# Patient Record
Sex: Female | Born: 1970 | Race: Black or African American | Hispanic: No | Marital: Married | Smoking: Former smoker
Health system: Southern US, Community
[De-identification: ages and names within clinical notes are randomized; demographics above are authoritative.]

## PROBLEM LIST (undated history)

## (undated) ENCOUNTER — Emergency Department (HOSPITAL_COMMUNITY): Disposition: A | Discharge status: Home / Self Care | Payer: Self-pay

## (undated) DIAGNOSIS — M1990 Unspecified osteoarthritis, unspecified site: Secondary | ICD-10-CM

## (undated) DIAGNOSIS — IMO0002 Reserved for concepts with insufficient information to code with codable children: Secondary | ICD-10-CM

## (undated) DIAGNOSIS — M722 Plantar fascial fibromatosis: Secondary | ICD-10-CM

## (undated) DIAGNOSIS — J40 Bronchitis, not specified as acute or chronic: Secondary | ICD-10-CM

## (undated) DIAGNOSIS — R0602 Shortness of breath: Secondary | ICD-10-CM

## (undated) DIAGNOSIS — R079 Chest pain, unspecified: Secondary | ICD-10-CM

## (undated) DIAGNOSIS — F419 Anxiety disorder, unspecified: Secondary | ICD-10-CM

## (undated) DIAGNOSIS — G5600 Carpal tunnel syndrome, unspecified upper limb: Secondary | ICD-10-CM

## (undated) HISTORY — PX: CHOLECYSTECTOMY: SHX55

---

## 2006-12-27 ENCOUNTER — Emergency Department (HOSPITAL_COMMUNITY): Admission: EM | Admit: 2006-12-27 | Discharge: 2006-12-27 | Payer: Self-pay | Admitting: Emergency Medicine

## 2007-04-12 ENCOUNTER — Emergency Department (HOSPITAL_COMMUNITY): Admission: EM | Admit: 2007-04-12 | Discharge: 2007-04-12 | Payer: Self-pay | Admitting: Emergency Medicine

## 2007-04-27 ENCOUNTER — Emergency Department (HOSPITAL_COMMUNITY): Admission: EM | Admit: 2007-04-27 | Discharge: 2007-04-27 | Payer: Self-pay | Admitting: Emergency Medicine

## 2010-11-13 LAB — URINALYSIS, ROUTINE W REFLEX MICROSCOPIC
Bilirubin Urine: NEGATIVE
Glucose, UA: NEGATIVE
Ketones, ur: NEGATIVE
Specific Gravity, Urine: 1.025
Urobilinogen, UA: 1

## 2010-11-13 LAB — DIFFERENTIAL
Eosinophils Absolute: 0.1
Eosinophils Relative: 1
Lymphocytes Relative: 42
Lymphs Abs: 3.5
Monocytes Absolute: 0.5

## 2010-11-13 LAB — CBC
HCT: 42.3
MCHC: 33.7
MCV: 92.1
Platelets: 225
RBC: 4.59
RDW: 13.7
WBC: 8.4

## 2010-11-13 LAB — URINE CULTURE: Culture: NO GROWTH

## 2010-11-13 LAB — URINE MICROSCOPIC-ADD ON

## 2010-11-13 LAB — I-STAT 8, (EC8 V) (CONVERTED LAB)
Glucose, Bld: 85
HCT: 47 — ABNORMAL HIGH
Operator id: 146091

## 2011-01-08 ENCOUNTER — Emergency Department (HOSPITAL_COMMUNITY): Payer: Self-pay

## 2011-01-08 ENCOUNTER — Emergency Department (HOSPITAL_COMMUNITY)
Admission: EM | Admit: 2011-01-08 | Discharge: 2011-01-08 | Disposition: A | Discharge status: Home / Self Care | Payer: Self-pay | Attending: Emergency Medicine | Admitting: Emergency Medicine

## 2011-01-08 DIAGNOSIS — IMO0002 Reserved for concepts with insufficient information to code with codable children: Secondary | ICD-10-CM | POA: Insufficient documentation

## 2011-01-08 DIAGNOSIS — M1990 Unspecified osteoarthritis, unspecified site: Secondary | ICD-10-CM

## 2011-01-08 DIAGNOSIS — M1710 Unilateral primary osteoarthritis, unspecified knee: Secondary | ICD-10-CM | POA: Insufficient documentation

## 2011-01-08 DIAGNOSIS — M25569 Pain in unspecified knee: Secondary | ICD-10-CM | POA: Insufficient documentation

## 2011-01-08 MED ORDER — IBUPROFEN 800 MG PO TABS: 800.0000 mg | ORAL_TABLET | Freq: Three times a day (TID) | ORAL | Status: AC

## 2011-01-08 MED ORDER — OXYCODONE-ACETAMINOPHEN 5-325 MG PO TABS
1.0000 | ORAL_TABLET | Freq: Once | ORAL | Status: AC
  Administered 2011-01-08: 1 via ORAL
  Filled 2011-01-08: qty 1

## 2011-01-08 MED ORDER — OXYCODONE-ACETAMINOPHEN 5-325 MG PO TABS: 1.0000 | ORAL_TABLET | ORAL | Status: AC | PRN

## 2011-01-08 NOTE — ED Notes (Signed)
Patient presents with right knee pain x 1 week with swelling today.  Patient denies recent injury.

## 2011-02-28 ENCOUNTER — Encounter (HOSPITAL_COMMUNITY): Payer: Self-pay | Admitting: *Deleted

## 2011-02-28 ENCOUNTER — Emergency Department (HOSPITAL_COMMUNITY)
Admission: EM | Admit: 2011-02-28 | Discharge: 2011-02-28 | Disposition: A | Discharge status: Home / Self Care | Payer: Self-pay | Attending: Emergency Medicine | Admitting: Emergency Medicine

## 2011-02-28 DIAGNOSIS — M79609 Pain in unspecified limb: Secondary | ICD-10-CM | POA: Insufficient documentation

## 2011-02-28 DIAGNOSIS — M5412 Radiculopathy, cervical region: Secondary | ICD-10-CM | POA: Insufficient documentation

## 2011-02-28 DIAGNOSIS — G8929 Other chronic pain: Secondary | ICD-10-CM | POA: Insufficient documentation

## 2011-02-28 DIAGNOSIS — M542 Cervicalgia: Secondary | ICD-10-CM | POA: Insufficient documentation

## 2011-02-28 DIAGNOSIS — M545 Low back pain, unspecified: Secondary | ICD-10-CM | POA: Insufficient documentation

## 2011-02-28 DIAGNOSIS — R29898 Other symptoms and signs involving the musculoskeletal system: Secondary | ICD-10-CM | POA: Insufficient documentation

## 2011-02-28 DIAGNOSIS — R209 Unspecified disturbances of skin sensation: Secondary | ICD-10-CM | POA: Insufficient documentation

## 2011-02-28 MED ORDER — PREDNISONE 20 MG PO TABS: ORAL_TABLET | ORAL | Status: AC

## 2011-02-28 MED ORDER — OXYCODONE-ACETAMINOPHEN 5-325 MG PO TABS: 2.0000 | ORAL_TABLET | ORAL | Status: AC | PRN

## 2011-02-28 NOTE — ED Provider Notes (Signed)
History     CSN: [id number]  Arrival date & time 02/28/11  1425   First MD Initiated Contact with Patient 02/28/11 1635      Chief Complaint  Patient presents with  . Neck Pain    (Consider location/radiation/quality/duration/timing/severity/associated sxs/prior treatment) HPI This 41 year old female has a few weeks neck pain radiating down her right arm her right hand mostly to the thumb and index finger with associated numbness to that distribution of the right arm and hand as well with some slight weakness with right hand grip and biceps strength as well. This pain is constant and moderately severe worse with movement of the right arm is nonexertional. She has no chest pain cough or shortness of breath. She has no trauma. She is chronic low back pain which is stable and unchanged. She has no pain no weakness or numbness to her left arm or legs. She has no change in bowel or bladder control. She has not had any prior treatment or evaluation for this problem. She has not had cervical radiculopathy symptoms in the past. History reviewed. No pertinent past medical history.  Past Surgical History  Procedure Date  . Cholecystectomy     History reviewed. No pertinent family history.  History  Substance Use Topics  . Smoking status: Current Everyday Smoker -- 0.5 packs/day  . Smokeless tobacco: Not on file  . Alcohol Use: Yes    OB History    Grav Para Term Preterm Abortions TAB SAB Ect Mult Living                  Review of Systems  Constitutional: Negative for fever.       10 Systems reviewed and are negative for acute change except as noted in the HPI.  HENT: Positive for neck pain. Negative for congestion.   Eyes: Negative for discharge and redness.  Respiratory: Negative for cough and shortness of breath.   Cardiovascular: Negative for chest pain.  Gastrointestinal: Negative for vomiting and abdominal pain.  Musculoskeletal: Negative for back pain.  Skin: Negative for  rash.  Neurological: Positive for weakness and numbness. Negative for syncope and headaches.  Psychiatric/Behavioral:       No behavior change.    Allergies  Review of patient's allergies indicates no known allergies.  Home Medications   Current Outpatient Rx  Name Route Sig Dispense Refill  . OXYCODONE-ACETAMINOPHEN 5-325 MG PO TABS Oral Take 2 tablets by mouth every 4 (four) hours as needed for pain. 30 tablet 0  . PREDNISONE 20 MG PO TABS  3 tabs po day one, then 2 po daily x 4 days 11 tablet 0    BP 122/74  Pulse 58  Temp(Src) 98.7 F (37.1 C) (Oral)  Resp 20  SpO2 99%  LMP 02/26/2011  Physical Exam  Nursing note and vitals reviewed. Constitutional:       Awake, alert, nontoxic appearance.  HENT:  Head: Atraumatic.  Eyes: Right eye exhibits no discharge. Left eye exhibits no discharge.  Neck: Neck supple.       Cervical spine and per cervical regions are tender bilaterally and in the midline worse in the right paracervical region without swelling or color change to the skin.  Cardiovascular: Normal rate and regular rhythm.   No murmur heard. Pulmonary/Chest: Effort normal and breath sounds normal. No respiratory distress. She has no wheezes. She has no rales. She exhibits no tenderness.  Abdominal: Soft. There is no tenderness. There is no rebound.  Musculoskeletal: She  exhibits no tenderness.       Baseline ROM, left arm and legs are nontender. Right arm has tenderness to the trapezius right paracervical region of the right shoulder joint itself is nontender with passive range of motion. The right arm itself is nontender and she has capillary refill less than 2 seconds in all digits to the right hand. She has decreased light touch to the right arm including the right thumb and index finger with normal light touch over the ulnar region. She has slight weakness with the right hand grip strength forearm wrist flexion and bicep strength with intact reflexes for biceps  brachioradialis bilaterally and absent triceps reflexes bilaterally.  Lymphadenopathy:    She has no cervical adenopathy.  Neurological:       Mental status and motor strength appears baseline for patient and situation.  Skin: No rash noted.  Psychiatric: She has a normal mood and affect.    ED Course  Procedures (including critical care time)  Labs Reviewed - No data to display No results found.   1. Cervical radiculopathy       MDM          Hurman Horn, MD 02/28/11 2149

## 2011-02-28 NOTE — ED Notes (Signed)
Reports neck pain x 1 week, radiates into back of head and into right shoulder, increases with movement.

## 2011-06-19 ENCOUNTER — Encounter (HOSPITAL_COMMUNITY): Payer: Self-pay | Admitting: Physical Medicine and Rehabilitation

## 2011-06-19 ENCOUNTER — Emergency Department (HOSPITAL_COMMUNITY)
Admission: EM | Admit: 2011-06-19 | Discharge: 2011-06-19 | Disposition: A | Discharge status: Home / Self Care | Payer: Self-pay | Attending: Emergency Medicine | Admitting: Emergency Medicine

## 2011-06-19 DIAGNOSIS — M25519 Pain in unspecified shoulder: Secondary | ICD-10-CM | POA: Insufficient documentation

## 2011-06-19 DIAGNOSIS — N390 Urinary tract infection, site not specified: Secondary | ICD-10-CM | POA: Insufficient documentation

## 2011-06-19 DIAGNOSIS — M545 Low back pain, unspecified: Secondary | ICD-10-CM | POA: Insufficient documentation

## 2011-06-19 DIAGNOSIS — M25511 Pain in right shoulder: Secondary | ICD-10-CM

## 2011-06-19 DIAGNOSIS — R319 Hematuria, unspecified: Secondary | ICD-10-CM | POA: Insufficient documentation

## 2011-06-19 HISTORY — DX: Unspecified osteoarthritis, unspecified site: M19.90

## 2011-06-19 LAB — URINALYSIS, ROUTINE W REFLEX MICROSCOPIC
Bilirubin Urine: NEGATIVE
Protein, ur: NEGATIVE mg/dL

## 2011-06-19 LAB — POCT PREGNANCY, URINE: Preg Test, Ur: NEGATIVE

## 2011-06-19 MED ORDER — DIAZEPAM 5 MG PO TABS: 5.0000 mg | ORAL_TABLET | Freq: Three times a day (TID) | ORAL | Status: AC | PRN

## 2011-06-19 MED ORDER — SULFAMETHOXAZOLE-TRIMETHOPRIM 800-160 MG PO TABS: 1.0000 | ORAL_TABLET | Freq: Two times a day (BID) | ORAL | Status: AC

## 2011-06-19 MED ORDER — HYDROCODONE-ACETAMINOPHEN 5-325 MG PO TABS: 2.0000 | ORAL_TABLET | ORAL | Status: AC | PRN

## 2011-06-19 MED ORDER — IBUPROFEN 800 MG PO TABS: 800.0000 mg | ORAL_TABLET | Freq: Three times a day (TID) | ORAL | Status: AC | PRN

## 2011-06-19 MED ORDER — DIAZEPAM 5 MG/ML IJ SOLN
5.0000 mg | Freq: Once | INTRAMUSCULAR | Status: AC
  Administered 2011-06-19: 5 mg via INTRAMUSCULAR
  Filled 2011-06-19: qty 2

## 2011-06-19 NOTE — ED Notes (Signed)
Pt presents to department for evaluation of lower back and R shoulder pain. States she lifts heavy bags of laundry at work, and thinks she "overdid" it last night. Woke up today very sore. Able to move all extremities without difficulty. 10/10 pain upon arrival to ED. She is alert and oriented x4. No signs of acute distress.

## 2011-06-19 NOTE — ED Provider Notes (Signed)
History     CSN: [id number]  Arrival date & time 06/19/11  1530   First MD Initiated Contact with Patient 06/19/11 1615      Chief Complaint  Patient presents with  . Back Pain  . Shoulder Pain    (Consider location/radiation/quality/duration/timing/severity/associated sxs/prior treatment) HPI Comments: Patient reports she was lifting heavy laundry at work when she hear a pop in her right lower back and had immediate pain in that area.  The pain is described as sharp and constant, worse with movement and palpation.  Not improved with tylenol.  Pain occasionally radiates to the left side of her lower back but does not radiate into the buttocks or legs. Patient also felt a "click" in her right shoulder while she was working and woke up this morning finding it painful to abduct her right arm or attempt to lift it above her head.  Pain is located in her posterior right shoulder, states it feels like it is "off set."  States the pain has been so bad a times today she has had associated  Pt has hx cervical radiculopathy on the right and has been referred to a neurosurgeon but has not followed up.    The history is provided by the patient.    Past Medical History  Diagnosis Date  . Arthritis     Past Surgical History  Procedure Date  . Cholecystectomy     No family history on file.  History  Substance Use Topics  . Smoking status: Former Smoker -- 0.5 packs/day  . Smokeless tobacco: Not on file  . Alcohol Use: Yes    OB History    Grav Para Term Preterm Abortions TAB SAB Ect Mult Living                  Review of Systems  Constitutional: Negative for fever and chills.  HENT: Negative for sore throat.   Respiratory: Negative for cough and shortness of breath.   Cardiovascular: Negative for chest pain.  Gastrointestinal: Negative for vomiting, abdominal pain, diarrhea and constipation.  Genitourinary: Negative for dysuria, urgency, frequency, vaginal bleeding, vaginal  discharge and menstrual problem.  Musculoskeletal: Positive for back pain and arthralgias.  Neurological: Positive for numbness. Negative for syncope and weakness.  All other systems reviewed and are negative.    Allergies  Review of patient's allergies indicates no known allergies.  Home Medications   Current Outpatient Rx  Name Route Sig Dispense Refill  . ACETAMINOPHEN 500 MG PO TABS Oral Take 2,000 mg by mouth every 6 (six) hours as needed. For pain      BP 138/72  Pulse 64  Temp(Src) 98.2 F (36.8 C) (Oral)  Resp 18  SpO2 97%  Physical Exam  Nursing note and vitals reviewed. Constitutional: She is oriented to person, place, and time. She appears well-developed and well-nourished.  HENT:  Head: Normocephalic and atraumatic.  Neck: Normal range of motion. Neck supple.  Cardiovascular: Normal rate and regular rhythm.   Pulmonary/Chest: Effort normal and breath sounds normal.  Abdominal: Soft. She exhibits no distension and no mass. There is no tenderness. There is no rebound and no guarding.  Musculoskeletal:       Right shoulder: She exhibits decreased range of motion, tenderness and pain. She exhibits no bony tenderness, no swelling, no effusion, no crepitus, no deformity, normal pulse and normal strength.       Cervical back: She exhibits tenderness.       Thoracic back: She exhibits  tenderness.       Lumbar back: She exhibits tenderness.       Patient with diffuse back pain, along entire spine as well as entire right side of her musculature.  No focal tenderness, no crepitus, no step-offs.  Lower extremities: strength 5/5, sensation slightly decreased on right, distal pulses intact and equal bilaterally.  Pt moving all extremities, moving all around on stretcher without difficulty.    Tender to palpation over posterior shoulder.  Decreased AROM due to pain - limited only in abducting right arm.  Negative drop test.  Grip strength normal.  Distal pulses intact.      Neurological: She is alert and oriented to person, place, and time. She has normal strength. No sensory deficit.  Psychiatric: She has a normal mood and affect. Her behavior is normal. Judgment and thought content normal.    ED Course  Procedures (including critical care time)  Labs Reviewed  URINALYSIS, ROUTINE W REFLEX MICROSCOPIC - Abnormal; Notable for the following:    APPearance CLOUDY (*)    Hgb urine dipstick MODERATE (*)    Ketones, ur 15 (*)    Leukocytes, UA MODERATE (*)    All other components within normal limits  POCT PREGNANCY, URINE  URINE MICROSCOPIC-ADD ON  URINE CULTURE   No results found.   Discussed UA results with patient.  Patient previously had hematuria and elev WBC on UA when here for neck pain.  Patient denies any urinary or vaginal symptoms, no hx kidney stones.  I will treat as UTI and have asked patient to follow up with PCP to recheck following antibiotic therapy.  I have discussed with patient that hematuria is abnormal and if she continues to have it, she may need to be seen by urology.  As patient has possible infection at this time, will treat this first, prior to urology referral.    1. Low back pain   2. Right shoulder pain   3. Hematuria   4. UTI (lower urinary tract infection)       MDM  Patient with diffuse back pain and posterior right shoulder pain that occurred while lifting, worse with movement and palpation.  No radiation into extremities, no localized tenderness, no weakness on exam.  Doubt radiculopathy.  Likely muscular injury.  Urine abnormalities discussed above.  Pt d/c home with pain medications, abx for UTI, PCP followup.  Resources given for f/u.   Patient verbalizes understanding and agrees with plan.          Dillard [place], [place] 06/19/11 [phone]

## 2011-06-19 NOTE — Discharge Instructions (Signed)
Read all of the information below and please use the resources above and below to find a primary care provider for close follow up.  As we discussed, please have your urine rechecked as you have blood in your urine.  If it does not resolve with the antibiotics, you may need to see a specialist for further evaluation.  If you develop weakness or numbness of your legs, loss of control of bowel or bladder, uncontrolled pain, or difficulty ambulating, please return to the ER immediately for reevaluation. You may return to the ER at any time for worsening condition or any new symptoms that concern you.   Back Pain, Adult Low back pain is very common. About 1 in 5 people have back pain.The cause of low back pain is rarely dangerous. The pain often gets better over time.About half of people with a sudden onset of back pain feel better in just 2 weeks. About 8 in 10 people feel better by 6 weeks.  CAUSES Some common causes of back pain include:  Strain of the muscles or ligaments supporting the spine.   Wear and tear (degeneration) of the spinal discs.   Arthritis.   Direct injury to the back.  DIAGNOSIS Most of the time, the direct cause of low back pain is not known.However, back pain can be treated effectively even when the exact cause of the pain is unknown.Answering your caregiver's questions about your overall health and symptoms is one of the most accurate ways to make sure the cause of your pain is not dangerous. If your caregiver needs more information, he or she may order lab work or imaging tests (X-rays or MRIs).However, even if imaging tests show changes in your back, this usually does not require surgery. HOME CARE INSTRUCTIONS For many people, back pain returns.Since low back pain is rarely dangerous, it is often a condition that people can learn to [facility] their own.   Remain active. It is stressful on the back to sit or stand in one place. Do not sit, drive, or stand in one place  for more than 30 minutes at a time. Take short walks on level surfaces as soon as pain allows.Try to increase the length of time you walk each day.   Do not stay in bed.Resting more than 1 or 2 days can delay your recovery.   Do not avoid exercise or work.Your body is made to move.It is not dangerous to be active, even though your back may hurt.Your back will likely heal faster if you return to being active before your pain is gone.   Pay attention to your body when you bend and lift. Many people have less discomfortwhen lifting if they bend their knees, keep the load close to their bodies,and avoid twisting. Often, the most comfortable positions are those that put less stress on your recovering back.   Find a comfortable position to sleep. Use a firm mattress and lie on your side with your knees slightly bent. If you lie on your back, put a pillow under your knees.   Only take over-the-counter or prescription medicines as directed by your caregiver. Over-the-counter medicines to reduce pain and inflammation are often the most helpful.Your caregiver may prescribe muscle relaxant drugs.These medicines help dull your pain so you can more quickly return to your normal activities and healthy exercise.   Put ice on the injured area.   Put ice in a plastic bag.   Place a towel between your skin and the bag.  Leave the ice on for 15 to 20 minutes, 3 to 4 times a day for the first 2 to 3 days. After that, ice and heat may be alternated to reduce pain and spasms.   Ask your caregiver about trying back exercises and gentle massage. This may be of some benefit.   Avoid feeling anxious or stressed.Stress increases muscle tension and can worsen back pain.It is important to recognize when you are anxious or stressed and learn ways to manage it.Exercise is a great option.  SEEK MEDICAL CARE IF:  You have pain that is not relieved with rest or medicine.   You have pain that does not improve  in 1 week.   You have new symptoms.   You are generally not feeling well.  SEEK IMMEDIATE MEDICAL CARE IF:   You have pain that radiates from your back into your legs.   You develop new bowel or bladder control problems.   You have unusual weakness or numbness in your arms or legs.   You develop nausea or vomiting.   You develop abdominal pain.   You feel faint.  Document Released: 02/08/2005 Document Revised: 01/28/2011 Document Reviewed: 06/29/2010 [facility] Patient Information 2012 [place], [place].   Lumbosacral Strain Lumbosacral strain is one of the most common causes of back pain. There are many causes of back pain. Most are not serious conditions. CAUSES  Your backbone (spinal column) is made up of 24 main vertebral bodies, the sacrum, and the coccyx. These are held together by muscles and tough, fibrous tissue (ligaments). Nerve roots pass through the openings between the vertebrae. A sudden move or injury to the back may cause injury to, or pressure on, these nerves. This may result in localized back pain or pain movement (radiation) into the buttocks, down the leg, and into the foot. Sharp, shooting pain from the buttock down the back of the leg (sciatica) is frequently associated with a ruptured (herniated) disk. Pain may be caused by muscle spasm alone. Your caregiver can often find the cause of your pain by the details of your symptoms and an exam. In some cases, you may need tests (such as X-rays). Your caregiver will work with you to decide if any tests are needed based on your specific exam. HOME CARE INSTRUCTIONS   Avoid an underactive lifestyle. Active exercise, as directed by your caregiver, is your greatest weapon against back pain.   Avoid hard physical activities (tennis, racquetball, waterskiing) if you are not in proper physical condition for it. This may aggravate or create problems.   If you have a back problem, avoid sports requiring sudden body movements.  Swimming and walking are generally safer activities.   Maintain good posture.   Avoid becoming overweight (obese).   Use bed rest for only the most extreme, sudden (acute) episode. Your caregiver will help you determine how much bed rest is necessary.   For acute conditions, you may put ice on the injured area.   Put ice in a plastic bag.   Place a towel between your skin and the bag.   Leave the ice on for 15 to 20 minutes at a time, every 2 hours, or as needed.   After you are improved and more active, it may help to apply heat for 30 minutes before activities.  See your caregiver if you are having pain that lasts longer than expected. Your caregiver can advise appropriate exercises or therapy if needed. With conditioning, most back problems can be avoided. SEEK  IMMEDIATE MEDICAL CARE IF:   You have numbness, tingling, weakness, or problems with the use of your arms or legs.   You experience severe back pain not relieved with medicines.   There is a change in bowel or bladder control.   You have increasing pain in any area of the body, including your belly (abdomen).   You notice shortness of breath, dizziness, or feel faint.   You feel sick to your stomach (nauseous), are throwing up (vomiting), or become sweaty.   You notice discoloration of your toes or legs, or your feet get very cold.   Your back pain is getting worse.   You have a fever.  MAKE SURE YOU:   Understand these instructions.   Will watch your condition.   Will get help right away if you are not doing well or get worse.  Document Released: 11/18/2004 Document Revised: 01/28/2011 Document Reviewed: 05/10/2008 [facility] Patient Information 2012 [place], [place]    .Shoulder Pain The shoulder is a ball and socket joint. The muscles and tendons (rotator cuff) are what keep the shoulder in its joint and stable. This collection of muscles and tendons holds in the head (ball) of the humerus (upper arm bone)  in the fossa (cup) of the scapula (shoulder blade). Today no reason was found for your shoulder pain. Often pain in the shoulder may be treated conservatively with temporary immobilization. For example, holding the shoulder in one place using a sling for rest. Physical therapy may be needed if problems continue. HOME CARE INSTRUCTIONS   Apply ice to the sore area for 15 to 20 minutes, 3 to 4 times per day for the first 2 days. Put the ice in a plastic bag. Place a towel between the bag of ice and your skin.   If you have or were given a shoulder sling and straps, do not remove for as long as directed by your caregiver or until you see a caregiver for a follow-up examination. If you need to remove it to shower or bathe, move your arm as little as possible.   Sleep on several pillows at night to lessen swelling and pain.   Only take over-the-counter or prescription medicines for pain, discomfort, or fever as directed by your caregiver.   Keep any follow-up appointments in order to avoid any type of permanent shoulder disability or chronic pain problems.  SEEK MEDICAL CARE IF:   Pain in your shoulder increases or new pain develops in your arm, hand, or fingers.   Your hand or fingers are colder than your other hand.   You do not obtain pain relief with the medications or your pain becomes worse.  SEEK IMMEDIATE MEDICAL CARE IF:   Your arm, hand, or fingers are numb or tingling.   Your arm, hand, or fingers are swollen, painful, or turn white or blue.   You develop chest pain or shortness of breath.  MAKE SURE YOU:   Understand these instructions.   Will watch your condition.   Will get help right away if you are not doing well or get worse.  Document Released: 11/18/2004 Document Revised: 01/28/2011 Document Reviewed: 01/23/2011 [facility] Patient Information 2012 [place], [place].   Shoulder Range of Motion Exercises The shoulder is the most flexible joint in the human body. Because  of this it is also the most unstable joint in the body. All ages can develop shoulder problems. Early treatment of problems is necessary for a good outcome. People react to shoulder pain  by decreasing the movement of the joint. After a brief period of time, the shoulder can become "frozen". This is an almost complete loss of the ability to move the damaged shoulder. Following injuries your caregivers can give you instructions on exercises to keep your range of motion (ability to move your shoulder freely), or regain it if it has been lost.  EXERCISES EXERCISES TO MAINTAIN THE MOBILITY OF YOUR SHOULDER: Codman's Exercise or Pendulum Exercise  This exercise may be performed in a prone (face-down) lying position or standing while leaning on a chair with the opposite arm. Its purpose is to relax the muscles in your shoulder and slowly but surely increase the range of motion and to relieve pain.   Lie on your stomach close to the side edge of the bed. Let your weak arm hang over the edge of the bed. Relax your shoulder, arm and hand. Let your shoulder blade relax and drop down.   Slowly and gently swing your arm forward and back. Do not use your neck muscles; relax them. It might be easier to have someone else gently start swinging your arm.   As pain decreases, increase your swing. To start, arm swing should begin at 15 degree angles. In time and as pain lessens, move to 30-45 degree angles. Start with swinging for about 15 seconds, and work towards swinging for 3 to 5 minutes.   This exercise may also be performed in a standing/bent over position.   Stand and hold onto a sturdy chair with your good arm. Bend forward at the waist and bend your knees slightly to help protect your back. Relax your weak arm, let it hang limp. Relax your shoulder blade and let it drop.   Keep your shoulder relaxed and use body motion to swing your arm in small circles.   Stand up tall and relax.   Repeat motion and  change direction of circles.   Start with swinging for about 30 seconds, and work towards swinging for 3 to 5 minutes.  STRETCHING EXERCISES:  Lift your arm out in front of you with the elbow bent at 90 degrees. Using your other arm gently pull the elbow forward and across your body.   Bend one arm behind you with the palm facing outward. Using the other arm, hold a towel or rope and reach this arm up above your head, then bend it at the elbow to move your wrist to behind your neck. Grab the free end of the towel with the hand behind your back. Gently pull the towel up with the hand behind your neck, gradually increasing the pull on the hand behind the small of your back. Then, gradually pull down with the hand behind the small of your back. This will pull the hand and arm behind your neck further. Both shoulders will have an increased range of motion with repetition of this exercise.  STRENGTHENING EXERCISES:  Standing with your arm at your side and straight out from your shoulder with the elbow bent at 90 degrees, hold onto a small weight and slowly raise your hand so it points straight up in the air. Repeat this five times to begin with, and gradually increase to ten times. Do this four times per day. As you grow stronger you can gradually increase the weight.   Repeat the above exercise, only this time using an elastic band. Start with your hand up in the air and pull down until your hand is by your side.  As you grow stronger, gradually increase the amount you pull by increasing the number or size of the elastic bands. Use the same amount of repetitions.   Standing with your hand at your side and holding onto a weight, gradually lift the hand in front of you until it is over your head. Do the same also with the hand remaining at your side and lift the hand away from your body until it is again over your head. Repeat this five times to begin with, and gradually increase to ten times. Do this four  times per day. As you grow stronger you can gradually increase the weight.  Document Released: 11/07/2002 Document Revised: 01/28/2011 Document Reviewed: 02/08/2005 [facility] Patient Information 2012 [place], [place].  If you have no primary doctor, here are some resources that may be helpful:  Medicaid-accepting [facility] Providers:   - [facility]- [address]      [phone]      Mon-Fri 9am-7pm, Sat 9am-1pm   - [facility]- [address] [place], [place]      [phone]   - [facility]- [address] [place]      [phone]   [facility]- [address]      [phone]   - Renaye Rakers- [address] [place], [address]      [phone]      Only accepts [place] patients       after they have her name applied to their card   Self Pay (no insurance) in [place]:   - Sickle Cell Patients: Dr Willey Blade, [facility] Internal Medicine      [address] [place]      [phone]   - [facility] Connect432 [phone]   - Physician Referral Service- [phone]   - [facility]- [address] [place]      [phone]   [facility] [place]- [address]   - [facility]- see information above      (Speak to [employer] if you do not have insurance)   - [facility]- [address] [place]      [phone]   - [facility] [place]- [address]      [phone]   - [facility]- [address]      [phone]   - Dr Julio Sicks-  [address], [place]      [phone]   - [facility]- [address]      [phone]   - [facility]- [address]      [phone]      Also [address]      [phone]   - [facility]- [address]      [phone]      1st and 3rd Saturday every month, 10am-1pm Other agencies that provide inexpensive medical care:    [facility]  [phone]    [facility]  [phone]    [facility]  [phone]    [facility]  [phone]    [facility]  [phone]  General Information: Finding a doctor when you do not have health insurance can be tricky. Although you are not limited by an insurance plan, you are of course limited by her  finances and how much but he can pay out of pocket.  What are your options if you don't have health insurance?   1) Find a Librarian, academic and Pay Out of Pocket Although you won't have to find out who is covered by your insurance plan, it is a good idea to ask around and get recommendations. You will then need to call the office and see if the doctor you have chosen will accept you as a new patient and what types of options they offer for patients who are self-pay. Some doctors offer discounts or will set up payment plans for their patients who do not have insurance, but you will need to ask so you aren't surprised when you get to your appointment.  2) Contact Your Local Health Department Not all health departments have doctors that can see patients for sick visits, but many do, so it is worth a call to see if yours does. If you don't know where your local health department is, you can check in your phone book. The CDC also has a tool to help you locate your state's health department, and many state websites also have listings of all of their local health departments.  3) Find a Walk-in Clinic If your illness is not likely to be very severe or complicated, you may want to try a walk in clinic. These are popping up all over the country in pharmacies, drugstores, and shopping centers. They're usually staffed by nurse practitioners or physician assistants that have been trained to treat common illnesses and complaints. They're usually fairly quick and inexpensive. However, if you have serious medical issues or chronic medical problems, these are probably not your best  option   RESOURCE GUIDE  Dental Problems  Patients with Medicaid: [facility] [phone] [address]                                           [phone] [employer] Phone:  [phone]                                                  Phone:  [phone]  If unable to pay or uninsured, contact:  [facility] or [facility] to become qualified for the adult dental clinic.  Chronic Pain Problems Contact [facility]  [phone] Patients need to be referred by their primary care doctor.  Insufficient Money for Medicine Contact United Way:  call "211" or [facility] [phone].  No Primary Care Doctor Call [facility]  [phone] Other agencies that provide inexpensive medical care    [facility]  [phone]    [facility]  [phone]    [facility]  [phone]    [facility]  [phone]    [facility]  [phone]    [facility]  [phone]  Psychological Services [facility]  [phone] [facility]  [phone] [facility]   [phone] (emergency services [phone])  Substance Abuse Resources [facility]  [phone] [facility] [phone] [facility]  [phone] [facility] [phone] Residential & Outpatient Substance Abuse Program  [phone]  Abuse/Neglect [facility] Child Abuse Hotline (813)271-0355 [facility] Child Abuse Hotline (534) 192-0120 (After Hours)  Emergency Shelter [facility] [phone]  Maternity Homes [facility] [place] [facility] [place] 5160184646 [facility] Alert Services [phone]  MRSA Hotline #:   989-469-2150    [facility] Resources  [facility] [place]     United Way                          [facility] Dept. [address] [place]                       [address]      [address]  Blondell Reveal Phone:  [phone]                                   Phone:  [phone]                 Phone:  [phone]  [facility] Mental Health Phone:  [phone]  [facility] Child Abuse Hotline 6288580208 540-414-9658 (After Hours)  Shoulder Range of Motion Exercises The shoulder is the most flexible joint in the human body. Because of this it is also the most unstable joint in the body. All ages can develop shoulder problems. Early treatment of problems is necessary for a good outcome. People react to shoulder pain by decreasing the movement of the joint. After a brief period of time, the shoulder can become "frozen". This is an almost complete loss of the ability to move the damaged shoulder. Following injuries your caregivers can give you instructions on exercises to keep your range of motion (ability to move your shoulder freely), or regain it if it has been lost.  EXERCISES EXERCISES TO MAINTAIN THE MOBILITY OF YOUR SHOULDER: Codman's Exercise or Pendulum Exercise  This exercise may be performed in a prone (face-down) lying position or standing while leaning on a chair with the opposite arm. Its purpose is to relax the muscles in your shoulder and slowly but surely increase the range of motion and to relieve pain.   Lie on your stomach close to the side edge of the bed. Let your weak arm hang over the edge of the bed. Relax your shoulder, arm and hand. Let your shoulder blade relax and drop down.   Slowly and gently swing your arm forward and back. Do not use your neck muscles; relax them. It might be easier to have someone else gently start swinging your arm.   As pain decreases, increase your swing. To start, arm swing should begin at 15 degree angles. In time and as pain lessens, move to 30-45 degree angles. Start with swinging for about 15  seconds, and work towards swinging for 3 to 5 minutes.   This exercise  may also be performed in a standing/bent over position.   Stand and hold onto a sturdy chair with your good arm. Bend forward at the waist and bend your knees slightly to help protect your back. Relax your weak arm, let it hang limp. Relax your shoulder blade and let it drop.   Keep your shoulder relaxed and use body motion to swing your arm in small circles.   Stand up tall and relax.   Repeat motion and change direction of circles.   Start with swinging for about 30 seconds, and work towards swinging for 3 to 5 minutes.  STRETCHING EXERCISES:  Lift your arm out in front of you with the elbow bent at 90 degrees. Using your other arm gently pull the elbow forward and across your body.   Bend one arm behind you with the palm facing outward. Using the other arm, hold a towel or rope and reach this arm up above your head, then bend it at the elbow to move your wrist to behind your neck. Grab the free end of the towel with the hand behind your back. Gently pull the towel up with the hand behind your neck, gradually increasing the pull on the hand behind the small of your back. Then, gradually pull down with the hand behind the small of your back. This will pull the hand and arm behind your neck further. Both shoulders will have an increased range of motion with repetition of this exercise.  STRENGTHENING EXERCISES:  Standing with your arm at your side and straight out from your shoulder with the elbow bent at 90 degrees, hold onto a small weight and slowly raise your hand so it points straight up in the air. Repeat this five times to begin with, and gradually increase to ten times. Do this four times per day. As you grow stronger you can gradually increase the weight.   Repeat the above exercise, only this time using an elastic band. Start with your hand up in the air and pull down until your hand is by your side. As you grow  stronger, gradually increase the amount you pull by increasing the number or size of the elastic bands. Use the same amount of repetitions.   Standing with your hand at your side and holding onto a weight, gradually lift the hand in front of you until it is over your head. Do the same also with the hand remaining at your side and lift the hand away from your body until it is again over your head. Repeat this five times to begin with, and gradually increase to ten times. Do this four times per day. As you grow stronger you can gradually increase the weight.  Document Released: 11/07/2002 Document Revised: 01/28/2011 Document Reviewed: 02/08/2005 [facility] Patient Information 2012 [place], [place].

## 2011-06-20 NOTE — ED Provider Notes (Signed)
Medical screening examination/treatment/procedure(s) were conducted as a shared visit with non-physician practitioner(s) and myself.  I personally evaluated the patient during the encounter.  Low back pain right shoulder pain likely musculoskeletal.  Minor evidence for urinary tract infection  Donnetta Hutching, MD 06/20/11 1006

## 2011-10-25 ENCOUNTER — Emergency Department (HOSPITAL_COMMUNITY): Payer: Self-pay

## 2011-10-25 ENCOUNTER — Emergency Department (HOSPITAL_COMMUNITY)
Admission: EM | Admit: 2011-10-25 | Discharge: 2011-10-26 | Disposition: A | Discharge status: Home / Self Care | Payer: Self-pay | Attending: Emergency Medicine | Admitting: Emergency Medicine

## 2011-10-25 DIAGNOSIS — F411 Generalized anxiety disorder: Secondary | ICD-10-CM | POA: Insufficient documentation

## 2011-10-25 DIAGNOSIS — Z720 Tobacco use: Secondary | ICD-10-CM

## 2011-10-25 DIAGNOSIS — R0602 Shortness of breath: Secondary | ICD-10-CM | POA: Insufficient documentation

## 2011-10-25 DIAGNOSIS — Z87891 Personal history of nicotine dependence: Secondary | ICD-10-CM | POA: Insufficient documentation

## 2011-10-25 DIAGNOSIS — Z8739 Personal history of other diseases of the musculoskeletal system and connective tissue: Secondary | ICD-10-CM | POA: Insufficient documentation

## 2011-10-25 DIAGNOSIS — J209 Acute bronchitis, unspecified: Secondary | ICD-10-CM | POA: Insufficient documentation

## 2011-10-25 DIAGNOSIS — F419 Anxiety disorder, unspecified: Secondary | ICD-10-CM

## 2011-10-25 LAB — POCT I-STAT, CHEM 8
BUN: 18 mg/dL (ref 6–23)
Chloride: 107 mEq/L (ref 96–112)
Creatinine, Ser: 1.2 mg/dL — ABNORMAL HIGH (ref 0.50–1.10)
Glucose, Bld: 80 mg/dL (ref 70–99)
Potassium: 3.7 mEq/L (ref 3.5–5.1)

## 2011-10-25 LAB — POCT PREGNANCY, URINE: Preg Test, Ur: NEGATIVE

## 2011-10-25 LAB — D-DIMER, QUANTITATIVE: D-Dimer, Quant: 0.3 ug/mL-FEU (ref 0.00–0.48)

## 2011-10-25 MED ORDER — IPRATROPIUM BROMIDE 0.02 % IN SOLN
0.5000 mg | Freq: Once | RESPIRATORY_TRACT | Status: AC
  Administered 2011-10-25: 0.5 mg via RESPIRATORY_TRACT
  Filled 2011-10-25: qty 2.5

## 2011-10-25 MED ORDER — ALBUTEROL SULFATE HFA 108 (90 BASE) MCG/ACT IN AERS
2.0000 | INHALATION_SPRAY | Freq: Once | RESPIRATORY_TRACT | Status: AC
  Administered 2011-10-26: 2 via RESPIRATORY_TRACT
  Filled 2011-10-25: qty 6.7

## 2011-10-25 MED ORDER — LORAZEPAM 2 MG/ML IJ SOLN
1.0000 mg | Freq: Once | INTRAMUSCULAR | Status: AC
  Administered 2011-10-25: 1 mg via INTRAVENOUS
  Filled 2011-10-25: qty 1

## 2011-10-25 MED ORDER — ALBUTEROL SULFATE (5 MG/ML) 0.5% IN NEBU
5.0000 mg | INHALATION_SOLUTION | Freq: Once | RESPIRATORY_TRACT | Status: AC
  Administered 2011-10-25: 5 mg via RESPIRATORY_TRACT
  Filled 2011-10-25: qty 1

## 2011-10-25 NOTE — ED Notes (Signed)
Patient states she is feeling better but her chest still feels a little tight.

## 2011-10-25 NOTE — ED Notes (Addendum)
EMS  Was called by the staff [facility] [facility]. PT reported smoking a Black and Mild and had CP with SHOB and LT arm tingling. EMS gave 3 NGT and 3 baby ASA.

## 2011-10-25 NOTE — ED Provider Notes (Signed)
History     CSN: [id number]  Arrival date & time 10/25/11  2009   First MD Initiated Contact with Patient 10/25/11 2020      Chief Complaint  Patient presents with  . Chest Pain  . Shortness of Breath    (Consider location/radiation/quality/duration/timing/severity/associated sxs/prior treatment) HPI Comments: Ms. Susan Wang presents for evaluation of chest pain and discomfort. The symptoms began just after finishing smoking a cigar. She describes a chest tightness, shortness of breath, and intense anxiety. She is recently homeless after losing her unemployment benefits and is staying any local homeless shelter. She reports feeling near constant stress and anxiety. She states the shelter environment feels unsafe. She has found herself feeling helpless and anxious since arriving at the shelter. She denies any prior history of heart disease, hypertension, diabetes, stroke but is a smoker. She denies any recent hospitalizations or immobilization. She denies any fevers or cough.  She reports chronic knee pain.  Patient is a 41 y.o. female presenting with chest pain and shortness of breath. The history is provided by the patient. No language interpreter was used.  Chest Pain The chest pain began 1 - 2 hours ago. The quality of the pain is described as tightness and squeezing. The pain does not radiate. Chest pain is worsened by deep breathing. Primary symptoms include shortness of breath. Pertinent negatives for primary symptoms include no fever, no fatigue, no cough, no wheezing, no palpitations and no dizziness.  Pertinent negatives for associated symptoms include no claudication, no diaphoresis, no lower extremity edema, no near-syncope, no numbness, no orthopnea, no paroxysmal nocturnal dyspnea and no weakness. She tried nothing for the symptoms. Risk factors include smoking/tobacco exposure, stress, lack of exercise and obesity. Past medical history comments: denies any significant other than tobacco  abuse  Her family medical history is significant for diabetes in family and hypertension in family.    Shortness of Breath  Associated symptoms include shortness of breath. Pertinent negatives include no chest pain, no orthopnea, no fever, no stridor, no cough and no wheezing. Past medical history comments: denies any significant other than tobacco abuse.    Past Medical History  Diagnosis Date  . Arthritis     Past Surgical History  Procedure Date  . Cholecystectomy     No family history on file.  History  Substance Use Topics  . Smoking status: Former Smoker -- 0.5 packs/day  . Smokeless tobacco: Not on file  . Alcohol Use: Yes    OB History    Grav Para Term Preterm Abortions TAB SAB Ect Mult Living                  Review of Systems  Constitutional: Negative for fever, chills, diaphoresis, activity change, appetite change, fatigue and unexpected weight change.  HENT: Negative.   Eyes: Negative.   Respiratory: Positive for chest tightness and shortness of breath. Negative for apnea, cough, choking, wheezing and stridor.   Cardiovascular: Negative for chest pain, palpitations, orthopnea, claudication, leg swelling and near-syncope.  Gastrointestinal: Negative.   Genitourinary: Negative.   Skin: Negative.   Neurological: Negative for dizziness, tremors, syncope, speech difficulty, weakness, light-headedness and numbness.  Hematological: Negative.   Psychiatric/Behavioral: Negative for suicidal ideas, hallucinations, confusion, dysphoric mood, decreased concentration and agitation. The patient is nervous/anxious.     Allergies  Other  Home Medications   Current Outpatient Rx  Name Route Sig Dispense Refill  . ACETAMINOPHEN 500 MG PO TABS Oral Take 2,000 mg by mouth every 6 (  six) hours as needed. For pain      BP 119/55  Pulse 72  Temp 98.2 F (36.8 C) (Oral)  Resp 12  SpO2 100%  LMP 10/21/2011  Physical Exam  Nursing note and vitals  reviewed. Constitutional: She is oriented to person, place, and time. She appears well-developed and well-nourished. No distress.  HENT:  Head: Normocephalic and atraumatic.  Right Ear: External ear normal.  Left Ear: External ear normal.  Nose: Nose normal.  Mouth/Throat: Oropharynx is clear and moist.  Eyes: Conjunctivae and EOM are normal. Pupils are equal, round, and reactive to light. Right eye exhibits no discharge. Left eye exhibits no discharge. No scleral icterus.  Neck: Normal range of motion. Neck supple. No JVD present. No tracheal deviation present. No thyromegaly present.  Cardiovascular: Normal rate, regular rhythm, normal heart sounds and intact distal pulses.  Exam reveals no gallop and no friction rub.   No murmur heard. Pulmonary/Chest: Effort normal. No stridor. No respiratory distress. She has wheezes. She has no rales. She exhibits no tenderness.  Abdominal: Soft. Bowel sounds are normal. She exhibits no distension and no mass. There is no tenderness. There is no rebound and no guarding.  Musculoskeletal: Normal range of motion. She exhibits no edema and no tenderness.  Lymphadenopathy:    She has no cervical adenopathy.  Neurological: She is alert and oriented to person, place, and time. No cranial nerve deficit. Coordination normal.  Skin: Skin is warm and dry. No rash noted. She is not diaphoretic. No erythema. No pallor.  Psychiatric: Her behavior is normal. Judgment and thought content normal. Her mood appears anxious. Her affect is not angry, not blunt, not labile and not inappropriate. Her speech is not rapid and/or pressured, not delayed, not tangential and not slurred. She is not agitated, not aggressive, is not hyperactive, not slowed, not withdrawn, not actively hallucinating and not combative. Cognition and memory are not impaired. She does not exhibit a depressed mood. She expresses no homicidal and no suicidal ideation. She expresses no suicidal plans and no  homicidal plans. She is communicative. She exhibits normal recent memory and normal remote memory. She is attentive.    ED Course  Procedures (including critical care time)  Labs Reviewed  POCT I-STAT, CHEM 8 - Abnormal; Notable for the following:    Creatinine, Ser 1.20 (*)     All other components within normal limits  POCT I-STAT TROPONIN I  POCT PREGNANCY, URINE  D-DIMER, QUANTITATIVE   Dg Chest 2 View  10/25/2011  *RADIOLOGY REPORT*  Clinical Data: Chest pain.  Dry cough.  Smoker.  CHEST - 2 VIEW  Comparison: CT dated 04/12/2007.  Findings: Poor inspiration.  Grossly normal sized heart.  Clear lungs.  Minimal diffuse peribronchial thickening and minimal scoliosis.  IMPRESSION: Minimal bronchitic changes.   Original Report Authenticated By: Darrol Angel, M.D.      No diagnosis found.   Date: 10/25/2011  Rate: 71 bpm  Rhythm: sinus  QRS Axis: normal  Intervals: normal  ST/T Wave abnormalities: normal  Conduction Disutrbances:early precordial R/S transition  Narrative Interpretation:   Old EKG Reviewed: none available      MDM  Patient presents for evaluation of acute onset shortness of breath, arm tingling, and chest discomfort. The symptoms began and shortly after completing smoking a cigar. She denies any previous symptoms like these however she states she was also upset and felt anxious just prior to the onset of symptoms. She is newly homeless and resides currently  in a local homeless shelter. She reports it being a very stressful and learned above and beyond the stress of being unemployed. Her unemployment benefits also expired within the last week. She currently appears anxious, has stable vital signs - NAD. No ischemic changes noted on EKG. Minimal changes consistent with chronic bronchitis noted on chest x-ray. Basic metabolic metabolic panel is essentially normal. Troponin x1 is negative. D-dimer is also negative. Patient's symptoms do not appear to be consistent with  thromboembolic event, an acute coronary syndrome, pneumonia, or pneumothorax. Plan at this time is symptomatic care. Will repeat EKG and troponin. Anticipate discharge home. Will encourage close outpatient followup.  0050.  Repeat EKG @00 :34 unchanged.  No evidence of acute ischemia.  Symptoms resolved s/p duoneb.   Plan administer albuterol inhaler to be used as needed for chest tightness.  Will review repeat trop, if neg - plan d/c home with treatment for acute bronchitis and anxiety.        Tobin Chad, MD 10/26/11 [phone]

## 2011-10-26 LAB — POCT I-STAT TROPONIN I

## 2011-10-26 MED ORDER — ALPRAZOLAM 0.25 MG PO TABS: 0.2500 mg | ORAL_TABLET | Freq: Three times a day (TID) | ORAL | Status: AC | PRN

## 2012-04-19 ENCOUNTER — Emergency Department (HOSPITAL_COMMUNITY)
Admission: EM | Admit: 2012-04-19 | Discharge: 2012-04-19 | Disposition: A | Discharge status: Home / Self Care | Payer: Self-pay | Attending: Emergency Medicine | Admitting: Emergency Medicine

## 2012-04-19 ENCOUNTER — Encounter (HOSPITAL_COMMUNITY): Payer: Self-pay | Admitting: Emergency Medicine

## 2012-04-19 DIAGNOSIS — M5431 Sciatica, right side: Secondary | ICD-10-CM

## 2012-04-19 DIAGNOSIS — M5430 Sciatica, unspecified side: Secondary | ICD-10-CM | POA: Insufficient documentation

## 2012-04-19 DIAGNOSIS — Z8739 Personal history of other diseases of the musculoskeletal system and connective tissue: Secondary | ICD-10-CM | POA: Insufficient documentation

## 2012-04-19 DIAGNOSIS — E669 Obesity, unspecified: Secondary | ICD-10-CM | POA: Insufficient documentation

## 2012-04-19 DIAGNOSIS — R5383 Other fatigue: Secondary | ICD-10-CM | POA: Insufficient documentation

## 2012-04-19 DIAGNOSIS — F17200 Nicotine dependence, unspecified, uncomplicated: Secondary | ICD-10-CM | POA: Insufficient documentation

## 2012-04-19 DIAGNOSIS — R5381 Other malaise: Secondary | ICD-10-CM | POA: Insufficient documentation

## 2012-04-19 DIAGNOSIS — R209 Unspecified disturbances of skin sensation: Secondary | ICD-10-CM | POA: Insufficient documentation

## 2012-04-19 DIAGNOSIS — R109 Unspecified abdominal pain: Secondary | ICD-10-CM | POA: Insufficient documentation

## 2012-04-19 DIAGNOSIS — G8929 Other chronic pain: Secondary | ICD-10-CM | POA: Insufficient documentation

## 2012-04-19 DIAGNOSIS — Z8709 Personal history of other diseases of the respiratory system: Secondary | ICD-10-CM | POA: Insufficient documentation

## 2012-04-19 HISTORY — DX: Bronchitis, not specified as acute or chronic: J40

## 2012-04-19 HISTORY — DX: Unspecified osteoarthritis, unspecified site: M19.90

## 2012-04-19 MED ORDER — DIAZEPAM 5 MG PO TABS: 5.0000 mg | ORAL_TABLET | Freq: Two times a day (BID) | ORAL | Status: DC

## 2012-04-19 MED ORDER — HYDROCODONE-ACETAMINOPHEN 5-325 MG PO TABS: 2.0000 | ORAL_TABLET | ORAL | Status: DC | PRN

## 2012-04-19 MED ORDER — KETOROLAC TROMETHAMINE 60 MG/2ML IM SOLN
60.0000 mg | Freq: Once | INTRAMUSCULAR | Status: AC
  Administered 2012-04-19: 60 mg via INTRAMUSCULAR
  Filled 2012-04-19: qty 2

## 2012-04-19 MED ORDER — MORPHINE SULFATE 4 MG/ML IJ SOLN
6.0000 mg | Freq: Once | INTRAMUSCULAR | Status: AC
  Administered 2012-04-19: 6 mg via INTRAMUSCULAR
  Filled 2012-04-19: qty 2

## 2012-04-19 MED ORDER — DIAZEPAM 5 MG/ML IJ SOLN
2.5000 mg | Freq: Once | INTRAMUSCULAR | Status: AC
  Administered 2012-04-19: 2.5 mg via INTRAMUSCULAR
  Filled 2012-04-19: qty 2

## 2012-04-19 MED ORDER — IBUPROFEN 600 MG PO TABS: 600.0000 mg | ORAL_TABLET | Freq: Four times a day (QID) | ORAL | Status: DC | PRN

## 2012-04-19 NOTE — ED Provider Notes (Signed)
History     CSN: [id number]  Arrival date & time 04/19/12  1651   First MD Initiated Contact with Patient 04/19/12 2110      Chief Complaint  Patient presents with  . Flank Pain  . Back Pain    (Consider location/radiation/quality/duration/timing/severity/associated sxs/prior treatment) Patient is a 42 y.o. female presenting with back pain.  Back Pain Location:  Lumbar spine (right sided) Quality:  Aching and shooting Radiates to:  R posterior upper leg, R knee and R foot Pain severity:  Severe Pain is:  Same all the time Onset quality:  Gradual Duration:  4 weeks Timing:  Intermittent Progression:  Worsening Chronicity:  New (although has hx of chronic low back pain) Context: lifting heavy objects and twisting   Context: not recent illness and not recent injury   Context comment:  Works as a Radio producer by:  Being still Worsened by:  Movement Ineffective treatments:  Ibuprofen Associated symptoms: paresthesias and weakness   Associated symptoms: no abdominal pain, no abdominal swelling, no bladder incontinence, no bowel incontinence, no chest pain, no dysuria, no fever, no headaches, no leg pain, no numbness, no pelvic pain and no perianal numbness   Risk factors: obesity   Risk factors: no hx of cancer, no hx of osteoporosis, no lack of exercise, no recent surgery and no steroid use     Past Medical History  Diagnosis Date  . Arthritis   . DJD (degenerative joint disease)   . Osteoarthritis     knees  . Bronchitis     Past Surgical History  Procedure Laterality Date  . Cholecystectomy      No family history on file.  History  Substance Use Topics  . Smoking status: Current Every Day Smoker -- 0.10 packs/day    Types: Cigars  . Smokeless tobacco: Not on file  . Alcohol Use: Yes     Comment: occasionally    OB History   Grav Para Term Preterm Abortions TAB SAB Ect Mult Living                  Review of Systems  Constitutional:  Negative for fever, chills, diaphoresis, activity change, appetite change and fatigue.  HENT: Negative for congestion, sore throat, facial swelling, rhinorrhea, neck pain and neck stiffness.   Eyes: Negative for photophobia and discharge.  Respiratory: Negative for cough, chest tightness and shortness of breath.   Cardiovascular: Negative for chest pain, palpitations and leg swelling.  Gastrointestinal: Negative for nausea, vomiting, abdominal pain, diarrhea and bowel incontinence.  Endocrine: Negative for polydipsia and polyuria.  Genitourinary: Negative for bladder incontinence, dysuria, frequency, difficulty urinating and pelvic pain.  Musculoskeletal: Positive for back pain. Negative for arthralgias.  Skin: Negative for color change and wound.  Allergic/Immunologic: Negative for immunocompromised state.  Neurological: Positive for weakness and paresthesias. Negative for facial asymmetry, numbness and headaches.  Hematological: Does not bruise/bleed easily.  Psychiatric/Behavioral: Negative for confusion and agitation.    Allergies  Other  Home Medications   Current Outpatient Rx  Name  Route  Sig  Dispense  Refill  . diazepam (VALIUM) 5 MG tablet   Oral   Take 1 tablet (5 mg total) by mouth 2 (two) times daily. For muscle spasm in back   10 tablet   0   . HYDROcodone-acetaminophen (NORCO/VICODIN) 5-325 MG per tablet   Oral   Take 2 tablets by mouth every 4 (four) hours as needed for pain.   10 tablet   0   .  ibuprofen (ADVIL,MOTRIN) 600 MG tablet   Oral   Take 1 tablet (600 mg total) by mouth every 6 (six) hours as needed for pain.   30 tablet   0     BP 137/69  Pulse 57  Temp(Src) 98.7 F (37.1 C) (Oral)  Resp 16  SpO2 99%  LMP 04/11/2012  Physical Exam  Constitutional: She is oriented to person, place, and time. She appears well-developed and well-nourished. No distress.  HENT:  Head: Normocephalic and atraumatic.  Mouth/Throat: No oropharyngeal exudate.   Eyes: Pupils are equal, round, and reactive to light.  Neck: Normal range of motion. Neck supple.  Cardiovascular: Normal rate, regular rhythm and normal heart sounds.  Exam reveals no gallop and no friction rub.   No murmur heard. Pulmonary/Chest: Effort normal and breath sounds normal. No respiratory distress. She has no wheezes. She has no rales.  Abdominal: Soft. Bowel sounds are normal. She exhibits no distension and no mass. There is no tenderness. There is no rebound and no guarding.  Musculoskeletal: Normal range of motion. She exhibits no edema and no tenderness.       Back:  Neurological: She is alert and oriented to person, place, and time. She displays no tremor. A sensory deficit (subjective decreased fine touch to posteriolateral R leg) is present. No cranial nerve deficit. She exhibits normal muscle tone. Coordination and gait (slowed) normal. GCS eye subscore is 4. GCS verbal subscore is 5. GCS motor subscore is 6.  Skin: Skin is warm and dry.  Psychiatric: She has a normal mood and affect.    ED Course  Procedures (including critical care time)  Labs Reviewed - No data to display No results found.   1. Sciatica, right       MDM  Pt is a 42 y.o. female with pertinent PMHX of DDD, arthritis, chronic low back pain who presents with about 1 month of R sided low back pain with radiation down R leg.  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  Symptoms worse w/ mvmt, none at rest.  Reports waxing/waning numbness to posterior leg, as well as difficulty bearing weight due to pain.  No fever, wt changes, hx of cancer, urinary symptoms, vom, d/a.  On PE, pt in NAD, ttp R low back, no ttp of leg.  +subjective decreased fine touch to lateral and posterior R leg for about 1 month as well.  No dec ROM, able to bear weight while ambulating which pt does with mild limp.  Ddx includes sciatica vs disk herniation.  Doubt cord compression/cauda equina, epidural abscess.  Given there is  no known injury, I do not believe pt requires emergent imaging.  Will treat symptomatically w/ IM valium, Toradol, morphine.  Pt can continue symptomatic control at home w/ short course of valium, norco, as well as ibuprofen, stretching.  Recommend she try to establish w/ a local PCP.  Return precautions given for new or worsening symptoms.      1. Sciatica, right      Labs and imaging considered in decision making, reviewed by myself.  Imaging interpreted by radiology. Pt care discussed with my attending, Dr. Juleen China.    Toy Cookey, MD 04/19/12 [phone]

## 2012-04-19 NOTE — ED Notes (Addendum)
Flank pain, low back pain, radiating down left leg to foot. Hx back pain, no recent injury, in job is lifting and moving furniture to clean, has been working today-- states "I have to work-- was homeless 4 months ago-- will go to work no Psychologist, clinical what"

## 2012-04-20 NOTE — ED Provider Notes (Signed)
I saw and evaluated the patient, reviewed the resident's note and I agree with the findings and plan.  42yF with back pain. Atraumatic. Non focal neuro exam. No blood thinning medications. No bladder/bowel incontinence or retention. Denies hx of IV drug use. Doubt cauda equina, spinal epidural abscess, spinal epidural hematoma, fracture, vertebral osteomyelitis/discitis or other potential emergent etiology. No indication for emergent imaging.  Plan symptomatic tx. Return precautions discussed. Outpt FU otherwise.   Raeford Razor, MD 04/20/12 1435

## 2012-09-08 ENCOUNTER — Emergency Department (HOSPITAL_COMMUNITY)
Admission: EM | Admit: 2012-09-08 | Discharge: 2012-09-08 | Disposition: A | Discharge status: Home / Self Care | Payer: Self-pay | Attending: Emergency Medicine | Admitting: Emergency Medicine

## 2012-09-08 ENCOUNTER — Encounter (HOSPITAL_COMMUNITY): Payer: Self-pay | Admitting: *Deleted

## 2012-09-08 DIAGNOSIS — M722 Plantar fascial fibromatosis: Secondary | ICD-10-CM | POA: Insufficient documentation

## 2012-09-08 DIAGNOSIS — Z8709 Personal history of other diseases of the respiratory system: Secondary | ICD-10-CM | POA: Insufficient documentation

## 2012-09-08 DIAGNOSIS — Z8739 Personal history of other diseases of the musculoskeletal system and connective tissue: Secondary | ICD-10-CM | POA: Insufficient documentation

## 2012-09-08 DIAGNOSIS — F17200 Nicotine dependence, unspecified, uncomplicated: Secondary | ICD-10-CM | POA: Insufficient documentation

## 2012-09-08 MED ORDER — IBUPROFEN 800 MG PO TABS: 800.0000 mg | ORAL_TABLET | Freq: Three times a day (TID) | ORAL | Status: DC

## 2012-09-08 MED ORDER — HYDROCODONE-ACETAMINOPHEN 5-325 MG PO TABS
1.0000 | ORAL_TABLET | ORAL | Status: DC | PRN
Start: 2012-09-08 — End: 2012-10-13

## 2012-09-08 MED ORDER — HYDROCODONE-ACETAMINOPHEN 5-325 MG PO TABS
1.0000 | ORAL_TABLET | Freq: Once | ORAL | Status: AC
  Administered 2012-09-08: 1 via ORAL
  Filled 2012-09-08: qty 1

## 2012-09-08 NOTE — ED Notes (Addendum)
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 For the last month and a half has had been to the heel portion of her left foot with ambulation. Reports tried nothing at home for pain relief.

## 2012-09-08 NOTE — ED Notes (Signed)
Pt ambulatory with limp from waiting room to fast track room.

## 2012-09-08 NOTE — ED Provider Notes (Signed)
   History    CSN: [id number] Arrival date & time 09/08/12  1044  First MD Initiated Contact with Patient 09/08/12 1126     Chief Complaint  Patient presents with  . Foot Pain   (Consider location/radiation/quality/duration/timing/severity/associated sxs/prior Treatment) Patient is a 42 y.o. female presenting with lower extremity pain. The history is provided by the patient. No language interpreter was used.  Foot Pain This is a new problem. The current episode started 1 to 4 weeks ago. Pertinent negatives include no chills, fever or numbness. Associated symptoms comments: Pain in heel of left foot for one month, progressively worse over time. Pain is the worst in the morning or after sitting for a period of time. No swelling, or known injury..   Past Medical History  Diagnosis Date  . Arthritis   . DJD (degenerative joint disease)   . Osteoarthritis     knees  . Bronchitis    Past Surgical History  Procedure Laterality Date  . Cholecystectomy     History reviewed. No pertinent family history. History  Substance Use Topics  . Smoking status: Current Every Day Smoker -- 0.10 packs/day    Types: Cigars  . Smokeless tobacco: Not on file  . Alcohol Use: Yes     Comment: occasionally   OB History   Grav Para Term Preterm Abortions TAB SAB Ect Mult Living                 Review of Systems  Constitutional: Negative for fever and chills.  Musculoskeletal:       See HPI.  Skin: Negative.  Negative for wound.  Neurological: Negative.  Negative for numbness.    Allergies  Other  Home Medications  No current outpatient prescriptions on file. BP 130/71  Pulse 65  Temp(Src) 98.3 F (36.8 C) (Oral)  Resp 18  SpO2 98% Physical Exam  Constitutional: She is oriented to person, place, and time. She appears well-developed and well-nourished.  Neck: Normal range of motion.  Pulmonary/Chest: Effort normal.  Musculoskeletal:  Left foot unremarkable in appearance. Point  tenderness along plantar surface. No lesion, focal swelling, foreign body. FROM.   Neurological: She is alert and oriented to person, place, and time.  Skin: Skin is warm and dry.    ED Course  Procedures (including critical care time) Labs Reviewed - No data to display No results found. No diagnosis found. 1. Plantar fasciitis  MDM  Uncomplicated plantar fasciitis.  Arnoldo Hooker, PA-C 09/08/12 1321

## 2012-09-09 NOTE — ED Provider Notes (Signed)
Medical screening examination/treatment/procedure(s) were performed by non-physician practitioner and as supervising physician I was immediately available for consultation/collaboration.   Carleene Steuber III, MD 09/09/12 1026

## 2012-09-22 DIAGNOSIS — R079 Chest pain, unspecified: Secondary | ICD-10-CM

## 2012-09-22 HISTORY — DX: Chest pain, unspecified: R07.9

## 2012-10-13 ENCOUNTER — Encounter (HOSPITAL_COMMUNITY): Payer: Self-pay | Admitting: Emergency Medicine

## 2012-10-13 ENCOUNTER — Emergency Department (HOSPITAL_COMMUNITY): Payer: Self-pay

## 2012-10-13 ENCOUNTER — Observation Stay (HOSPITAL_COMMUNITY)
Admission: EM | Admit: 2012-10-13 | Discharge: 2012-10-14 | Disposition: A | Discharge status: Home / Self Care | Payer: MEDICAID | Attending: Internal Medicine | Admitting: Internal Medicine

## 2012-10-13 DIAGNOSIS — M129 Arthropathy, unspecified: Secondary | ICD-10-CM

## 2012-10-13 DIAGNOSIS — M940 Chondrocostal junction syndrome [Tietze]: Principal | ICD-10-CM | POA: Insufficient documentation

## 2012-10-13 DIAGNOSIS — J40 Bronchitis, not specified as acute or chronic: Secondary | ICD-10-CM

## 2012-10-13 DIAGNOSIS — K219 Gastro-esophageal reflux disease without esophagitis: Secondary | ICD-10-CM | POA: Insufficient documentation

## 2012-10-13 DIAGNOSIS — R079 Chest pain, unspecified: Secondary | ICD-10-CM | POA: Insufficient documentation

## 2012-10-13 DIAGNOSIS — M1990 Unspecified osteoarthritis, unspecified site: Secondary | ICD-10-CM | POA: Insufficient documentation

## 2012-10-13 HISTORY — DX: Anxiety disorder, unspecified: F41.9

## 2012-10-13 HISTORY — DX: Chest pain, unspecified: R07.9

## 2012-10-13 HISTORY — DX: Shortness of breath: R06.02

## 2012-10-13 HISTORY — DX: Carpal tunnel syndrome, unspecified upper limb: G56.00

## 2012-10-13 HISTORY — DX: Plantar fascial fibromatosis: M72.2

## 2012-10-13 LAB — CBC
HCT: 38.4 % (ref 36.0–46.0)
MCHC: 33.9 g/dL (ref 30.0–36.0)
Platelets: 176 10*3/uL (ref 150–400)
RDW: 13.2 % (ref 11.5–15.5)

## 2012-10-13 LAB — COMPREHENSIVE METABOLIC PANEL
AST: 16 U/L (ref 0–37)
Albumin: 3.8 g/dL (ref 3.5–5.2)
Alkaline Phosphatase: 54 U/L (ref 39–117)
BUN: 10 mg/dL (ref 6–23)
Potassium: 3.9 mEq/L (ref 3.5–5.1)
Total Protein: 6.8 g/dL (ref 6.0–8.3)

## 2012-10-13 LAB — HEMOGLOBIN A1C: Hgb A1c MFr Bld: 5 % (ref <5.7)

## 2012-10-13 LAB — PROTIME-INR
INR: 1.06 (ref 0.00–1.49)
Prothrombin Time: 13.6 seconds (ref 11.6–15.2)

## 2012-10-13 LAB — TROPONIN I: Troponin I: <0.3 ng/mL (ref <0.30)

## 2012-10-13 LAB — TSH: TSH: 2.297 u[IU]/mL (ref 0.350–4.500)

## 2012-10-13 LAB — RAPID URINE DRUG SCREEN, HOSP PERFORMED: Benzodiazepines: NOT DETECTED

## 2012-10-13 LAB — POCT I-STAT TROPONIN I
Troponin i, poc: 0.02 ng/mL (ref 0.00–0.08)
Troponin i, poc: 0.01 ng/mL (ref 0.00–0.08)

## 2012-10-13 LAB — LIPASE, BLOOD: Lipase: 13 U/L (ref 11–59)

## 2012-10-13 MED ORDER — HEPARIN SODIUM (PORCINE) 5000 UNIT/ML IJ SOLN
5000.0000 [IU] | Freq: Three times a day (TID) | INTRAMUSCULAR | Status: DC
  Administered 2012-10-13 – 2012-10-14 (×3): 5000 [IU] via SUBCUTANEOUS
  Filled 2012-10-13 (×6): qty 1

## 2012-10-13 MED ORDER — NITROGLYCERIN 0.4 MG SL SUBL
0.4000 mg | SUBLINGUAL_TABLET | SUBLINGUAL | Status: DC | PRN
  Administered 2012-10-13 (×2): 0.4 mg via SUBLINGUAL

## 2012-10-13 MED ORDER — MORPHINE SULFATE 4 MG/ML IJ SOLN
4.0000 mg | Freq: Once | INTRAMUSCULAR | Status: AC
  Administered 2012-10-13: 4 mg via INTRAVENOUS
  Filled 2012-10-13: qty 1

## 2012-10-13 MED ORDER — SODIUM CHLORIDE 0.9 % IJ SOLN
3.0000 mL | Freq: Two times a day (BID) | INTRAMUSCULAR | Status: DC
  Administered 2012-10-13: 3 mL via INTRAVENOUS

## 2012-10-13 MED ORDER — SODIUM CHLORIDE 0.9 % IV SOLN: 250.0000 mL | INTRAVENOUS | Status: DC | PRN

## 2012-10-13 MED ORDER — SODIUM CHLORIDE 0.9 % IV SOLN
1000.0000 mL | INTRAVENOUS | Status: DC
  Administered 2012-10-13: 1000 mL via INTRAVENOUS

## 2012-10-13 MED ORDER — ACETAMINOPHEN 325 MG PO TABS: 650.0000 mg | ORAL_TABLET | Freq: Four times a day (QID) | ORAL | Status: DC | PRN

## 2012-10-13 MED ORDER — PANTOPRAZOLE SODIUM 40 MG PO TBEC
40.0000 mg | DELAYED_RELEASE_TABLET | Freq: Every day | ORAL | Status: DC
  Administered 2012-10-13 – 2012-10-14 (×2): 40 mg via ORAL
  Filled 2012-10-13: qty 1

## 2012-10-13 MED ORDER — NITROGLYCERIN 0.4 MG SL SUBL
0.4000 mg | SUBLINGUAL_TABLET | SUBLINGUAL | Status: DC | PRN
  Administered 2012-10-13 (×2): 0.4 mg via SUBLINGUAL
  Filled 2012-10-13: qty 25

## 2012-10-13 MED ORDER — SODIUM CHLORIDE 0.9 % IJ SOLN: 3.0000 mL | INTRAMUSCULAR | Status: DC | PRN

## 2012-10-13 MED ORDER — ALBUTEROL SULFATE HFA 108 (90 BASE) MCG/ACT IN AERS: 2.0000 | INHALATION_SPRAY | Freq: Four times a day (QID) | RESPIRATORY_TRACT | Status: DC | PRN

## 2012-10-13 MED ORDER — SODIUM CHLORIDE 0.9 % IJ SOLN
3.0000 mL | Freq: Two times a day (BID) | INTRAMUSCULAR | Status: DC
  Administered 2012-10-14: 3 mL via INTRAVENOUS

## 2012-10-13 MED ORDER — ASPIRIN 81 MG PO CHEW: 324.0000 mg | CHEWABLE_TABLET | Freq: Once | ORAL | Status: DC

## 2012-10-13 MED ORDER — MORPHINE SULFATE 2 MG/ML IJ SOLN
2.0000 mg | INTRAMUSCULAR | Status: DC | PRN
  Administered 2012-10-13 – 2012-10-14 (×3): 2 mg via INTRAVENOUS
  Filled 2012-10-13 (×3): qty 1

## 2012-10-13 NOTE — ED Notes (Signed)
Pharmacy tech at bedside 

## 2012-10-13 NOTE — Progress Notes (Signed)
Pt was resting in bed and complained of CP 10/10. V/S stable. EKG obtained. 2 SL nitro given with no relief. 2 mg of morphine given, which relieved pain. Md on call made aware. No new orders received. Will cont to monitor pt.

## 2012-10-13 NOTE — ED Notes (Signed)
Pt reports that she walks to work everyday, over the past couple months she has been getting chest pain while she walks to work, usually once she gets there the pain goes away but today the pain continued while at work and was worse today. Reports the pain radiates into back, worsens with deep breath and movement. Denies recent sx/traveling. Pt in nad, skin warm and dry, resp e/u.

## 2012-10-13 NOTE — ED Notes (Signed)
Per EMS - pt was walking to work when she started to get some chest tightness, usually she gets this but this time it didn't go away. Worse with movement. 12 lead unremarkable. Diaphoretic on scene. BP 160/90 ems gave 324 ASA and 1 Nitro. Has hx of anxiety and degenerative disc. NKA. Started a 20 G in left wrist. HR 68.

## 2012-10-13 NOTE — ED Notes (Signed)
Lab tech at bedside

## 2012-10-13 NOTE — ED Provider Notes (Signed)
CSN: [id number]     Arrival date & time 10/13/12  0944 History   First MD Initiated Contact with Patient 10/13/12 0945     Chief complaint: Chest pain  HPI Patient presents to the emergency room with complaints of chest tightness. She has noticed that she gets chest pain/tightness in her daily walk to work usually it is not too severe and resolves on its own.  She has never had this evaluated. Today, while walking to work the pain started and became more severe than usual. It also did not resolve. The patient states the pain is severe in the center of her chest and it radiates to her back. EMS was contacted and gave her aspirin and nitroglycerin. She does not feel that that has helped. She also feels short of breath and has some nausea. She did get diaphoretic. Patient does smoke cigarettes but does not have any known history of heart disease. She denies any family history of heart problems. Denies any recent trips or travel. No leg swelling. No abdominal pain. No numbness or weakness. Past Medical History  Diagnosis Date  . Arthritis   . DJD (degenerative joint disease)   . Osteoarthritis     knees  . Bronchitis    Past Surgical History  Procedure Laterality Date  . Cholecystectomy     No family history on file. History  Substance Use Topics  . Smoking status: Current Every Day Smoker -- 0.10 packs/day    Types: Cigars  . Smokeless tobacco: Not on file  . Alcohol Use: Yes     Comment: occasionally   OB History   Grav Para Term Preterm Abortions TAB SAB Ect Mult Living                 Review of Systems  All other systems reviewed and are negative.    Allergies  Other  Home Medications   Current Outpatient Rx  Name  Route  Sig  Dispense  Refill  . albuterol (PROVENTIL HFA;VENTOLIN HFA) 108 (90 BASE) MCG/ACT inhaler   Inhalation   Inhale 2 puffs into the lungs every 6 (six) hours as needed for wheezing.          BP 114/67  Pulse 52  Resp 16  Ht 5' (1.524 m)  Wt  198 lb (89.812 kg)  BMI 38.67 kg/m2  SpO2 100%  LMP 10/06/2012 Physical Exam  Nursing note and vitals reviewed. Constitutional: She appears well-developed and well-nourished.  Uncomfortable appearing  HENT:  Head: Normocephalic and atraumatic.  Right Ear: External ear normal.  Left Ear: External ear normal.  Eyes: Conjunctivae are normal. Right eye exhibits no discharge. Left eye exhibits no discharge. No scleral icterus.  Neck: Neck supple. No tracheal deviation present.  Cardiovascular: Normal rate, regular rhythm and intact distal pulses.   Pulmonary/Chest: Effort normal and breath sounds normal. No stridor. No respiratory distress. She has no wheezes. She has no rales.  Abdominal: Soft. Bowel sounds are normal. She exhibits no distension. There is no tenderness. There is no rebound and no guarding.  Musculoskeletal: She exhibits no edema and no tenderness.  Neurological: She is alert. She has normal strength. No sensory deficit. Cranial nerve deficit:  no gross defecits noted. She exhibits normal muscle tone. She displays no seizure activity. Coordination normal.  Skin: Skin is warm and dry. No rash noted.  Psychiatric: She has a normal mood and affect.    ED Course  EKG Normal sinus rhythm rate 50 Normal axis, normal  intervals Normal ST-T wave is No significant change when compared to prior EKG dated 10/25/2011 Procedures (including critical care time)  Medications  0.9 %  sodium chloride infusion (1,000 mLs Intravenous New Bag/Given 10/13/12 0959)  nitroGLYCERIN (NITROSTAT) SL tablet 0.4 mg (0.4 mg Sublingual Given 10/13/12 0959)  morphine 4 MG/ML injection 4 mg (4 mg Intravenous Given 10/13/12 1015)    Labs Reviewed  CBC  COMPREHENSIVE METABOLIC PANEL  PROTIME-INR  APTT  POCT I-STAT TROPONIN I   Dg Chest Portable 1 View  10/13/2012   CLINICAL DATA:  42 year old female with chest pain shortness of Breath.  EXAM: PORTABLE CHEST - 1 VIEW  COMPARISON:  10/25/2011 and  earlier.  FINDINGS: AP portable view at 1008 hr. Insert contour stable lung volumes. Allowing for portable technique, the lungs are clear. Stable visualized osseous structures.  IMPRESSION: No acute cardiopulmonary abnormality.   Electronically Signed   By: Augusto Gamble   On: 10/13/2012 10:12   1. Chest pain     MDM  Patient's initial evaluation emergency Department is reassuring. Her EKGs not showing ST elevation. initial troponin is normal. Her exercise-induced symptoms however are concerning for the possibility of acute coronary syndrome.  I discussed the findings with the patient I recommend observation stay in the hospital for further cardiac testing, possible stress testing.  Celene Kras, MD 10/13/12 1149

## 2012-10-13 NOTE — ED Notes (Signed)
Pt ambulated to restroom. 

## 2012-10-13 NOTE — ED Notes (Signed)
CXR at bedside

## 2012-10-13 NOTE — ED Notes (Signed)
Admitting physicians at bedside.

## 2012-10-13 NOTE — ED Notes (Signed)
Pt c/o CP

## 2012-10-13 NOTE — H&P (Signed)
Date: 10/13/2012               Patient Name:  Susan Wang MRN: [id number]  DOB: 13-Nov-1970 Age / Sex: 42 y.o., female   PCP: Pcp Not In System         Medical Service: Internal Medicine Teaching Service         Attending Physician: Dr. Jonah Blue, DO    First Contact: Dr. Glendell Docker Pager: [phone]   Second Contact: Dr. Dierdre Searles, Na Pager: [phone]       After Hours (After 5p/  First Contact Pager: [phone]  weekends / holidays): Second Contact Pager: [phone]   Chief Complaint: Chest pain  History of Present Illness:   Patient is a 42 y.o woman with pats medical history of osteoarthritis, tobacco abuse s/p cessation 2 years ago, plantar fascitis, carpel tunnel syndrome, bronchitis who presented to the ED with chest pain that started while she was walking to her work that is crushing in quality with radiation to the back and increasing on deep inspiration. The patient describes that she experiences chest tightness with exertion for the last 5 years. She states that she feels a fluttering sensation when she exerts herself while walking to work. This morning, the chest pain was worse than had been normal for her. Furthermore, the pain has been ongoing since this morning, while it normally is alleviated by rest and lasts 10-20 min. She says that the pain felt like gas, and when she burped it got much better, but returned a few minutes later.There is associated dizziness, sweating, shortness of breath. No history of nausea, vomiting, palpitation.She describes a burning chest pain when she lies down at night associated with a bad taste in her mouth, which is made worse with large meals before bedtime.  She also mentions about swelling on her left side of leg.There is no history of DM, HTN, CAD, dyslipidemia, orthopnea, asthma, fever, travel, intake of medications for pain/birth control pills. She is on albuterol inhaler for bronchitis. She works at [employer]. She stopped smoking 2 years ago until  which she smoked 1 pack a day for 13 years. She currently smokes marijuana ( last event was 1 week back ). She does not drink alcohol or use any other drugs. Her sleep and appetite is normal. She is trying to loose her weight by adopting a healthy diet 2 months back. Family history is positive for diabetes in dad. The patient also admits to being under a lot of stress as she is living with her fiancee in a foreclosed house that was recently bought. Last year, the patient ended up homeless after losing her home, and she is afraid that this may happen again. Of note, the patient had a similar episode of chest pain in September of 2013. Cardiac Enzymes and EKG were negative for ACS and the patients symptoms resolved with nebs at that encounter. The team at that time thought this pain was likely secondary to bronchitis.   Meds: Current Facility-Administered Medications  Medication Dose Route Frequency Provider Last Rate Last Dose   0.9 %  sodium chloride infusion  250 mL Intravenous PRN Na Dierdre Searles, MD       acetaminophen (TYLENOL) tablet 650 mg  650 mg Oral Q6H PRN Pleas Koch, MD       albuterol (PROVENTIL HFA;VENTOLIN HFA) 108 (90 BASE) MCG/ACT inhaler 2 puff  2 puff Inhalation Q6H PRN Na Li, MD       heparin injection 5,000 Units  5,000  Units Subcutaneous Q8H Na Li, MD   5,000 Units at 10/13/12 1656   morphine 2 MG/ML injection 2 mg  2 mg Intravenous Q4H PRN Na Li, MD   2 mg at 10/13/12 1518   nitroGLYCERIN (NITROSTAT) SL tablet 0.4 mg  0.4 mg Sublingual Q5 min PRN Na Dierdre Searles, MD       pantoprazole (PROTONIX) EC tablet 40 mg  40 mg Oral Daily Pleas Koch, MD       sodium chloride 0.9 % injection 3 mL  3 mL Intravenous Q12H Na Li, MD       sodium chloride 0.9 % injection 3 mL  3 mL Intravenous Q12H Na Li, MD       sodium chloride 0.9 % injection 3 mL  3 mL Intravenous PRN Dede Query, MD        Allergies: Allergies as of 10/13/2012 - Review Complete 10/13/2012  Allergen Reaction Noted     Other Itching and Swelling 10/25/2011   Past Medical History  Diagnosis Date   Arthritis    DJD (degenerative joint disease)    Osteoarthritis     knees   Bronchitis    Plantar fasciitis    Chest pain 09/2012   Anxiety    Shortness of breath    Carpal tunnel syndrome    Past Surgical History  Procedure Laterality Date   Cholecystectomy     Family History  Problem Relation Age of Onset   Diabetes Father    Other Mother    Healthy Sister    Healthy Brother    History   Social History   Marital Status: Single    Spouse Name: N/A    Number of Children: N/A   Years of Education: N/A   Occupational History   Not on file.   Social History Main Topics   Smoking status: Former Smoker -- 1.00 packs/day for 13 years    Types: Cigars    Quit date: 05/14/2010   Smokeless tobacco: Never Used   Alcohol Use: No     Comment: occasionally   Drug Use: Yes    Special: Marijuana   Sexual Activity: Not on file   Other Topics Concern   Not on file   Social History Narrative   Lives with her [place] in [place]. She has a job.     Review of Systems:  Review of Systems  Constitutional: Positive for weight loss. Negative for fever, chills, malaise/fatigue and diaphoresis.  HENT: Negative for hearing loss, ear pain, nosebleeds, congestion, sore throat, neck pain, tinnitus and ear discharge.   Eyes: Negative for blurred vision, double vision, photophobia, pain, discharge and redness.  Respiratory: Positive for cough and shortness of breath. Negative for hemoptysis, wheezing and stridor.   Cardiovascular: Positive for chest pain and palpitations. Negative for orthopnea, claudication, leg swelling and PND.       Unilateral leg swelling on Left foot  Gastrointestinal: Positive for heartburn. Negative for nausea, vomiting, abdominal pain, diarrhea, constipation, blood in stool and melena.  Genitourinary: Negative for dysuria, urgency, frequency, hematuria  and flank pain.  Musculoskeletal: Positive for back pain and joint pain. Negative for myalgias and falls.  Skin: Negative for itching and rash.  Neurological: Negative for dizziness, tingling, tremors, sensory change, speech change, focal weakness, seizures, loss of consciousness, weakness and headaches.  Endo/Heme/Allergies: Negative for environmental allergies and polydipsia. Does not bruise/bleed easily.  Psychiatric/Behavioral: Positive for substance abuse. Negative for depression, suicidal ideas, hallucinations and memory loss. The patient is not  nervous/anxious.        Abuse of marijuana   Physical Exam: Blood pressure 171/75, pulse 58, temperature 98.4 F (36.9 C), temperature source Oral, resp. rate 18, height 5' (1.524 m), weight 89.812 kg (198 lb), last menstrual period 10/06/2012, SpO2 100.00%.  Physical Exam  Constitutional: She is oriented to person, place, and time. She appears well-developed and well-nourished. No distress.  HENT:  Head: Normocephalic and atraumatic.  Eyes: EOM are normal. Pupils are equal, round, and reactive to light.  Neck: Normal range of motion. Neck supple. No JVD present.  Cardiovascular: Regular rhythm, S1 normal, S2 normal, normal heart sounds and intact distal pulses.   No extrasystoles are present. Bradycardia present.  Exam reveals no gallop, no distant heart sounds and no friction rub.   No murmur heard. Bradycardia in the range of 50's  Pulmonary/Chest: Effort normal and breath sounds normal. No respiratory distress. She has no wheezes. She has no rales. She exhibits tenderness.  The chest tenderness on palpation is comparable to the chest pain  Abdominal: Soft. Bowel sounds are normal. She exhibits no distension and no mass. There is no tenderness. There is no rebound and no guarding. No hernia.  Musculoskeletal: Normal range of motion. She exhibits tenderness. She exhibits no edema.       Left foot: She exhibits bony tenderness.  Left feet  shows callus which is tender to palpation  Neurological: She is alert and oriented to person, place, and time. She has normal reflexes.  Skin: No rash noted. She is not diaphoretic. No erythema. No pallor.  Psychiatric: She has a normal mood and affect. Her behavior is normal. Judgment and thought content normal.    Labs:  Basic Metabolic Panel:  Recent Labs Lab 10/13/12 1013 10/13/12 1629  NA 138  --   K 3.9  --   CL 105  --   CO2 23  --   GLUCOSE 91  --   BUN 10  --   CREATININE 0.72  --   CALCIUM 9.0  --   MG  --  2.2    Liver Function Tests:  Recent Labs Lab 10/13/12 1013  AST 16  ALT 13  ALKPHOS 54  BILITOT 0.6  PROT 6.8  ALBUMIN 3.8    Recent Labs Lab 10/13/12 1629  LIPASE 13   No results found for this basename: AMMONIA,  in the last 168 hours  CBC:  Recent Labs Lab 10/13/12 1013  WBC 6.1  HGB 13.0  HCT 38.4  MCV 91.9  PLT 176    Cardiac Enzymes:  Recent Labs Lab 10/13/12 1428  TROPONINI <0.30    BNP: No components found with this basename: POCBNP,   CBG: No results found for this basename: GLUCAP,  in the last 168 hours  Coagulation Studies:  Recent Labs  10/13/12 1013  LABPROT 13.6  INR 1.06   Drugs of Abuse     Component Value Date/Time   LABOPIA NONE DETECTED 10/13/2012 1350   COCAINSCRNUR NONE DETECTED 10/13/2012 1350   LABBENZ NONE DETECTED 10/13/2012 1350   AMPHETMU NONE DETECTED 10/13/2012 1350   THCU POSITIVE* 10/13/2012 1350   LABBARB NONE DETECTED 10/13/2012 1350     Other results: EKG: sinus bradycardia.   Imaging results:  Dg Chest Portable 1 View  10/13/2012   CLINICAL DATA:  42 year old female with chest pain shortness of Breath.  EXAM: PORTABLE CHEST - 1 VIEW  COMPARISON:  10/25/2011 and earlier.  FINDINGS: AP portable view at 1008  hr. Insert contour stable lung volumes. Allowing for portable technique, the lungs are clear. Stable visualized osseous structures.  IMPRESSION: No acute cardiopulmonary  abnormality.   Electronically Signed   By: Augusto Gamble   On: 10/13/2012 10:12   Assessment & Plan by Problem: Principal Problem:   Chest pain  Susan Wang is a 42 y.o. female who presents with a cc of chest pain  Assessment Plan  Chest Pain The patients chest pain is likely secondary to MSK pain(costochondritis), mild bronchitis, and GERD. It is also likely that there is a anxiety component. The exertional dyspnea/fluttering component of her chest pain is unlikely due to a cardiac cause (TIMI=0), but cannot be fully ruled out at this time. As this is her second hospitalization and she does not have a PCP, the patient was admitted for observation, cycling troponins, and a possible stress test. PE is unlikely as the patient was ruled out with PERC. PNA is unlikely as patient has stable vital signs, no current cough, and no abnormalities on EKG.  - Telemetry  - F/U Troponin x 3 (neg x1_  - F/U EKG  - Tylenol prn and IV Morphine prn for severe pain  - Albuterol inhaler prn  - Nitro sl tablet prn  - F/U Mag, Lipids, HgA1C, TSH, Lipase, PT/INR  - CMP and CBC unremarkable  - THC Positive   Bradycardia  Likely normal variant. Possible side effect of marijuana intoxication as it can cause increased parasympathetic tone.  - F/U EKG  - F/U Tele overnight     GERD  The patients bad taste in mouth after eating and burning chest pain is likely 2/2 GERD.  - PPI   Osteoarthritis   - Tylenol prn     Nutrition/DVT    Regular Diet/Heparin     Dispo: Disposition is deferred at this time, awaiting improvement of current medical problems. Anticipated discharge in approximately 2 day(s).   The patient does not have a current PCP (Pcp Not In System) and does need an [facility] hospital follow-up appointment after discharge.  The patient does have transportation limitations that hinder transportation to clinic appointments.  SignedGretchen Short  10/13/2012, 5:27 PM

## 2012-10-13 NOTE — H&P (Signed)
Date: 10/13/2012               Patient Name:  Susan Wang MRN: [id number]  DOB: 1970-12-27 Age / Sex: 42 y.o., female   PCP: Pcp Not In System         Medical Service: Internal Medicine Teaching Service         Attending Physician: Dr. Celene Kras, MD    First Contact: Dr. Angelina Sheriff, MD Pager: [phone]  Second Contact: Dr. Dede Query, MD Pager: [phone]       After Hours (After 5p/  First Contact Pager: [phone]  weekends / holidays): Second Contact Pager: [phone]   Chief Complaint: Chest Pain  History of Present Illness: Susan Wang is a 42 y.o. female (TIMI=0, PERC negative) with a pmhx of arthritis, tobacco abuse s/p cessation 2 years ago, and chest pain associated with anxiety/stress who presents to the ED with a CC of chest pain. The patient describes that she experiences chest tightness with exertion for the last 5 years. She states that she feels a fluttering sensation when she exerts herself while walking to work. This morning, the chest pain was worse than had been normal for her. Furthermore, the pain has been ongoing since this morning, while it normally is alleviated by rest. The pain has a pressure like quality and radiates to her back. She says that the pain felt like gas, and when she burped it got much better, but returned a few minutes later. She describes a burning chest pain when she lies down at night associated with a bad taste in her mouth, which is made worse with large meals before bedtime. The pain is made worse with deep inspiration, and it is reproducible with palpation of the sternum.  The patient also admits to being under a lot of stress as she is living with her fiancee in a foreclosed house that was recently bought. Last year, the patient ended up homeless after losing her home, and she is afraid that this may happen again.  Of note, the patient had a similar episode of chest pain in September of 2013. Cardiac Enzymes and EKG were negative for ACS and the  patients symptoms resolved with nebs at that encounter. The team at that time thought this pain was likely secondary to bronchitis.  Meds: Current Facility-Administered Medications  Medication Dose Route Frequency Provider Last Rate Last Dose  . 0.9 %  sodium chloride infusion  1,000 mL Intravenous Continuous Celene Kras, MD 20 mL/hr at 10/13/12 0959 1,000 mL at 10/13/12 0959  . albuterol (PROVENTIL HFA;VENTOLIN HFA) 108 (90 BASE) MCG/ACT inhaler 2 puff  2 puff Inhalation Q6H PRN Na Li, MD      . morphine 2 MG/ML injection 2 mg  2 mg Intravenous Q4H PRN Na Li, MD   2 mg at 10/13/12 1518  . nitroGLYCERIN (NITROSTAT) SL tablet 0.4 mg  0.4 mg Sublingual Q5 min PRN Celene Kras, MD   0.4 mg at 10/13/12 1500   Current Outpatient Prescriptions  Medication Sig Dispense Refill  . albuterol (PROVENTIL HFA;VENTOLIN HFA) 108 (90 BASE) MCG/ACT inhaler Inhale 2 puffs into the lungs every 6 (six) hours as needed for wheezing.        Allergies: Allergies as of 10/13/2012 - Review Complete 10/13/2012  Allergen Reaction Noted  . Other Itching and Swelling 10/25/2011   Past Medical History  Diagnosis Date  . Arthritis   . DJD (degenerative joint disease)   . Osteoarthritis  knees  . Bronchitis   . Plantar fasciitis    Past Surgical History  Procedure Laterality Date  . Cholecystectomy     Family History  Problem Relation Age of Onset  . Diabetes Father   . Other Mother   . Healthy Sister   . Healthy Brother    History   Social History  . Marital Status: Single    Spouse Name: N/A    Number of Children: N/A  . Years of Education: N/A   Occupational History  . Not on file.   Social History Main Topics  . Smoking status: Former Smoker -- 1.00 packs/day for 13 years    Types: Cigars    Quit date: 05/14/2010  . Smokeless tobacco: Not on file  . Alcohol Use: No     Comment: occasionally  . Drug Use: Yes    Special: Marijuana  . Sexual Activity: Not on file   Other Topics  Concern  . Not on file   Social History Narrative   Lives with her [place] in [place]. She has a job.     Review of Systems: Review of Systems  Constitutional: Positive for weight loss. Negative for fever, chills, malaise/fatigue and diaphoresis.  HENT: Negative for sore throat.   Eyes: Negative for blurred vision and double vision.  Respiratory: Positive for cough and shortness of breath. Negative for hemoptysis, sputum production and wheezing.   Cardiovascular: Positive for chest pain and palpitations. Negative for orthopnea, leg swelling and PND.  Gastrointestinal: Positive for heartburn. Negative for nausea, vomiting, abdominal pain, diarrhea and constipation.  Genitourinary: Negative for dysuria, urgency and frequency.  Musculoskeletal: Positive for back pain and joint pain.  Skin: Negative for itching and rash.  Neurological: Negative for dizziness, tingling, tremors, sensory change, speech change, focal weakness, weakness and headaches.  Psychiatric/Behavioral: The patient is nervous/anxious.     Physical Exam: Blood pressure 131/69, pulse 52, resp. rate 18, height 5' (1.524 m), weight 198 lb (89.812 kg), last menstrual period 10/06/2012, SpO2 100.00%. Physical Exam  Constitutional: She is oriented to person, place, and time. She appears well-developed and well-nourished.  HENT:  Head: Normocephalic and atraumatic.  Mouth/Throat: Oropharynx is clear and moist. No oropharyngeal exudate.  Eyes: EOM are normal.  Neck: Normal range of motion. Neck supple. No tracheal deviation present. No thyromegaly present.  Cardiovascular: Regular rhythm, normal heart sounds and intact distal pulses.  Exam reveals no friction rub.   No murmur heard. bradycardic  Pulmonary/Chest: Effort normal and breath sounds normal. No respiratory distress. She has no wheezes. She has no rales.  Mildly coarse breath sounds  Abdominal: Soft. Bowel sounds are normal. She exhibits no distension. There is  no tenderness.  Musculoskeletal: She exhibits tenderness. She exhibits no edema.  Tender on left foot. Non-tender in left calf.  Lymphadenopathy:    She has no cervical adenopathy.  Neurological: She is alert and oriented to person, place, and time.     Lab results: Basic Metabolic Panel:  Recent Labs  78/29/56 1013  NA 138  K 3.9  CL 105  CO2 23  GLUCOSE 91  BUN 10  CREATININE 0.72  CALCIUM 9.0   Liver Function Tests:  Recent Labs  10/13/12 1013  AST 16  ALT 13  ALKPHOS 54  BILITOT 0.6  PROT 6.8  ALBUMIN 3.8   CBC:  Recent Labs  10/13/12 1013  WBC 6.1  HGB 13.0  HCT 38.4  MCV 91.9  PLT 176   Cardiac Enzymes:  Recent Labs  10/13/12 1428  TROPONINI <0.30   Urine Drug Screen: Drugs of Abuse     Component Value Date/Time   LABOPIA NONE DETECTED 10/13/2012 1350  Positive for [facility]   Imaging results:  Dg Chest Portable 1 View  10/13/2012   CLINICAL DATA:  42 year old female with chest pain shortness of Breath.  EXAM: PORTABLE CHEST - 1 VIEW  COMPARISON:  10/25/2011 and earlier.  FINDINGS: AP portable view at 1008 hr. Insert contour stable lung volumes. Allowing for portable technique, the lungs are clear. Stable visualized osseous structures.  IMPRESSION: No acute cardiopulmonary abnormality.   Electronically Signed   By: Augusto Gamble   On: 10/13/2012 10:12    Other results: EKG: normal EKG, normal sinus rhythm, unchanged from previous tracings, sinus bradycardia.  Assessment & Plan by Problem: Principal Problem:   Chest pain  Susan Wang is a 42 y.o. female who presents with a cc of chest pain.  Assessment Plan  Chest Pain The patients chest pain is likely secondary to MSK pain(costochondritis), mild bronchitis, and GERD.  It is also likely that there is a anxiety component. The exertional dyspnea/fluttering component of her chest pain is unlikely due to a cardiac cause (TIMI=0), but cannot be fully ruled out at this time. As this is her second  hospitalization and she does not have a PCP, the patient was admitted for observation, cycling troponins, and a possible stress test.  PE is unlikely as the patient was ruled out with PERC. PNA is unlikely as patient has stable vital signs, no current cough, and no abnormalities on EKG.   - Telemetry - F/U Troponin x 3 (neg x1_ - F/U EKG - Tylenol prn and IV Morphine prn for severe pain - Albuterol inhaler prn - Nitro sl tablet prn - F/U  Mag, Lipids, HgA1C, TSH, Lipase, PT/INR - CMP and CBC unremarkable - THC Positive  Bradycardia Likely normal variant. Possible side effect of marijuana intoxication as it can cause increased parasympathetic tone.  - F/U EKG - F/U Tele overnight  GERD The patients bad taste in mouth after eating and burning chest pain is likely 2/2 GERD. - PPI  Osteoarthritis - Tylenol prn  Nutrition/DVT Regular Diet/Heparin    Dispo: Disposition is deferred at this time, awaiting improvement of current medical problems. Anticipated discharge in approximately 1-2 day(s).   The patient does not have a current PCP (Pcp Not In System) and does need an [facility] hospital follow-up appointment after discharge.  The patient does have transportation limitations that hinder transportation to clinic appointments.  Signed: Pleas Koch, MD 10/13/2012, 3:29 PM

## 2012-10-14 DIAGNOSIS — K219 Gastro-esophageal reflux disease without esophagitis: Secondary | ICD-10-CM

## 2012-10-14 DIAGNOSIS — M940 Chondrocostal junction syndrome [Tietze]: Secondary | ICD-10-CM

## 2012-10-14 LAB — CBC
MCH: 31.3 pg (ref 26.0–34.0)
MCHC: 34 g/dL (ref 30.0–36.0)
Platelets: 150 10*3/uL (ref 150–400)
RBC: 3.77 MIL/uL — ABNORMAL LOW (ref 3.87–5.11)

## 2012-10-14 LAB — LIPID PANEL
Cholesterol: 145 mg/dL (ref 0–200)
Total CHOL/HDL Ratio: 2.6 RATIO

## 2012-10-14 LAB — BASIC METABOLIC PANEL
Calcium: 8.4 mg/dL (ref 8.4–10.5)
GFR calc Af Amer: >90 mL/min (ref >90)
GFR calc non Af Amer: >90 mL/min (ref >90)
Glucose, Bld: 99 mg/dL (ref 70–99)
Sodium: 137 mEq/L (ref 135–145)

## 2012-10-14 MED ORDER — HYDROCODONE-ACETAMINOPHEN 5-325 MG PO TABS: 1.0000 | ORAL_TABLET | Freq: Four times a day (QID) | ORAL | Status: DC | PRN

## 2012-10-14 MED ORDER — ALBUTEROL SULFATE HFA 108 (90 BASE) MCG/ACT IN AERS: 2.0000 | INHALATION_SPRAY | Freq: Four times a day (QID) | RESPIRATORY_TRACT | Status: DC | PRN

## 2012-10-14 MED ORDER — PANTOPRAZOLE SODIUM 40 MG PO TBEC: 40.0000 mg | DELAYED_RELEASE_TABLET | Freq: Every day | ORAL | Status: DC

## 2012-10-14 NOTE — Progress Notes (Signed)
Subjective:  Patient continues to have 10/10 chest pain that was improved with Morphine IV and sl nitro. The pain is reproducible with palpation of the left sternal border.  Objective: Vital signs in last 24 hours: Filed Vitals:   10/13/12 2004 10/13/12 2009 10/13/12 2100 10/14/12 0500  BP: 128/79 114/68 121/56 121/66  Pulse: 90 79 52 56  Temp:   98.2 F (36.8 C) 98.1 F (36.7 C)  TempSrc:      Resp:   20 20  Height:      Weight:    197 lb (89.359 kg)  SpO2:   98% 98%   Weight change:   Intake/Output Summary (Last 24 hours) at 10/14/12 1017 Last data filed at 10/14/12 0835  Gross per 24 hour  Intake    360 ml  Output      0 ml  Net    360 ml   Constitutional: She is oriented to person, place, and time. She appears well-developed and well-nourished.  HENT:  Head: Normocephalic and atraumatic.  Mouth/Throat: Oropharynx is clear and moist. No oropharyngeal exudate.  Eyes: EOM are normal.  Neck: Normal range of motion. Neck supple. No tracheal deviation present. No thyromegaly present.  Cardiovascular: Regular rhythm, normal heart sounds and intact distal pulses. Exam reveals no friction rub.  No murmur heard. bradycardic  Pulmonary/Chest: Effort normal and breath sounds normal. No respiratory distress. She has no wheezes. She has no rales.  Mildly coarse breath sounds  Abdominal: Soft. Bowel sounds are normal. She exhibits no distension. There is no tenderness.  Musculoskeletal: She exhibits tenderness. She exhibits no edema. She has chest wall pain that is tender to palpation along the left side of her sternum. Tender on left foot. Non-tender in left calf.  Lymphadenopathy:  She has no cervical adenopathy.  Neurological: She is alert and oriented to person, place, and time.    Lab Results: Basic Metabolic Panel:  Recent Labs Lab 10/13/12 1013 10/13/12 1629 10/14/12 0240  NA 138  --  137  K 3.9  --  3.5  CL 105  --  106  CO2 23  --  23  GLUCOSE 91  --  99    BUN 10  --  12  CREATININE 0.72  --  0.73  CALCIUM 9.0  --  8.4  MG  --  2.2  --    Liver Function Tests:  Recent Labs Lab 10/13/12 1013  AST 16  ALT 13  ALKPHOS 54  BILITOT 0.6  PROT 6.8  ALBUMIN 3.8    Recent Labs Lab 10/13/12 1629  LIPASE 13   CBC:  Recent Labs Lab 10/13/12 1013 10/14/12 0240  WBC 6.1 5.5  HGB 13.0 11.8*  HCT 38.4 34.7*  MCV 91.9 92.0  PLT 176 150   Cardiac Enzymes:  Recent Labs Lab 10/13/12 1428 10/13/12 2027 10/14/12 0240  TROPONINI <0.30 <0.30 <0.30   Hemoglobin A1C:  Recent Labs Lab 10/13/12 1629  HGBA1C 5.0   Fasting Lipid Panel:  Recent Labs Lab 10/14/12 0240  CHOL 145  HDL 56  LDLCALC 76  TRIG 65  CHOLHDL 2.6   Thyroid Function Tests:  Recent Labs Lab 10/13/12 1629  TSH 2.297   Coagulation:  Recent Labs Lab 10/13/12 1013  LABPROT 13.6  INR 1.06   Urine Drug Screen: Drugs of Abuse     Component Value Date/Time   LABOPIA NONE DETECTED 10/13/2012 1350   COCAINSCRNUR NONE DETECTED 10/13/2012 1350   LABBENZ NONE DETECTED 10/13/2012  1350   AMPHETMU NONE DETECTED 10/13/2012 1350   THCU POSITIVE* 10/13/2012 1350   LABBARB NONE DETECTED 10/13/2012 1350    Alcohol Level: No results found for this basename: ETH,  in the last 168 hours Urinalysis: No results found for this basename: COLORURINE, APPERANCEUR, LABSPEC, PHURINE, GLUCOSEU, HGBUR, BILIRUBINUR, KETONESUR, PROTEINUR, UROBILINOGEN, NITRITE, LEUKOCYTESUR,  in the last 168 hours   Micro Results: No results found for this or any previous visit (from the past 240 hour(s)). Studies/Results: Dg Chest Portable 1 View  10/13/2012   CLINICAL DATA:  42 year old female with chest pain shortness of Breath.  EXAM: PORTABLE CHEST - 1 VIEW  COMPARISON:  10/25/2011 and earlier.  FINDINGS: AP portable view at 1008 hr. Insert contour stable lung volumes. Allowing for portable technique, the lungs are clear. Stable visualized osseous structures.  IMPRESSION: No acute  cardiopulmonary abnormality.   Electronically Signed   By: Augusto Gamble   On: 10/13/2012 10:12   Medications: I have reviewed the patient's current medications. Scheduled Meds: . heparin  5,000 Units Subcutaneous Q8H  . pantoprazole  40 mg Oral Daily  . sodium chloride  3 mL Intravenous Q12H  . sodium chloride  3 mL Intravenous Q12H   Continuous Infusions:  PRN Meds:.sodium chloride, acetaminophen, albuterol, morphine injection, nitroGLYCERIN, sodium chloride Assessment/Plan: Principal Problem:   Chest pain  Susan Wang is a 42 y.o. female who presents with a cc of chest pain.  Assessment  Plan   Chest Pain  The patients chest pain is likely secondary to MSK pain(costochondritis) and GERD. It is also likely that there is a anxiety component. The exertional dyspnea/fluttering component of her chest pain is unlikely due to a cardiac cause (TIMI=0), but could not be fully ruled out in the ED. As this is her second hospitalization and she does not have a PCP, the patient was admitted for observation, cycling troponins, and a possible stress test. As cardiac enzymes were negative and EKG demonstrated no changes, ACS is ruled out.  PE is unlikely as the patient was ruled out with PERC. PNA is unlikely as patient has stable vital signs, no current cough, and no abnormalities on EKG.  - Telemetry  - Troponin x 3 (neg x3) - EKG=sinus brady  - Tylenol prn and IV Morphine prn for severe pain  - Albuterol inhaler prn  - Nitro sl tablet prn  - Mag 2.2, Lipids (LDL 76), HgA1C(5.0) TSH Nl, Lipase Nl - CMP and CBC unremarkable  - THC Positive   Plan to f/u as outpatient with [facility] Cardiology.  Bradycardia  Likely normal variant. Possible side effect of marijuana intoxication as it can cause increased parasympathetic tone.  - EKG stable sinus brady - Tele overnight sinus brady 50's  GERD  The patients bad taste in mouth after eating and burning chest pain is likely 2/2 GERD.  - PPI  Osteoarthritis   - Tylenol prn   Nutrition/DVT  Regular Diet/Heparin       Dispo: Disposition is deferred at this time, awaiting improvement of current medical problems.  Anticipated discharge in approximately 1-2 day(s).   The patient does not have a current PCP (Pcp Not In System) and does need an [facility] hospital follow-up appointment after discharge.  The patient does have transportation limitations that hinder transportation to clinic appointments.  .Services Needed at time of discharge: Y = Yes, Blank = No PT:   OT:   RN:   Equipment:   Other:     LOS: 1  day   Pleas Koch, MD 10/14/2012, 10:17 AM

## 2012-10-14 NOTE — H&P (Signed)
Internal Medicine On-Call Attending Admission Note Date: 10/14/2012  Patient name: Susan Wang Medical record number: [id number] Date of birth: April 20, 1970 Age: 42 y.o. Gender: female  I saw and evaluated the patient. I reviewed the resident's note and I agree with the resident's findings and plan as documented in the resident's note, with the following additional comments.  Chief Complaint(s): Chest pain  History - key components related to admission: Patient is a 42 year old woman with history of bronchitis, plantar fasciitis, osteoarthritis, and other problems as outlined in the medical history admitted with complaint of right parasternal chest pain.  Patient reports occasional brief episodes of chest tightness with exertion chronically, and yesterday developed right parasternal chest pain that is constant.  The pain is aggravated by deep inspiration.  She previously smoked one pack per day for about 13 years, and quit smoking 2 years ago.  She denies any family history of coronary artery disease; she denies personal history of heart disease or hypertension.   Physical Exam - key components related to admission:  Filed Vitals:   10/13/12 2004 10/13/12 2009 10/13/12 2100 10/14/12 0500  BP: 128/79 114/68 121/56 121/66  Pulse: 90 79 52 56  Temp:   98.2 F (36.8 C) 98.1 F (36.7 C)  TempSrc:      Resp:   20 20  Height:      Weight:    197 lb (89.359 kg)  SpO2:   98% 98%    General: Alert, no distress Lungs: Clear Chest: Patient is quite tender in the right parasternal area, and reports that pressing in this area her reproduces her pain. Heart: Regular; S1-S2, no S3, no S4, no murmurs Abdomen: Bowel sounds present, soft, nontender Extremities: No edema; no calf tenderness  Lab results:   Basic Metabolic Panel:  Recent Labs  41/32/44 1013 10/13/12 1629 10/14/12 0240  NA 138  --  137  K 3.9  --  3.5  CL 105  --  106  CO2 23  --  23  GLUCOSE 91  --  99  BUN 10  --  12   CREATININE 0.72  --  0.73  CALCIUM 9.0  --  8.4  MG  --  2.2  --     Liver Function Tests:  Recent Labs  10/13/12 1013  AST 16  ALT 13  ALKPHOS 54  BILITOT 0.6  PROT 6.8  ALBUMIN 3.8    Recent Labs  10/13/12 1629  LIPASE 13   No results found for this basename: AMMONIA,  in the last 72 hours  CBC:  Recent Labs  10/13/12 1013 10/14/12 0240  WBC 6.1 5.5  HGB 13.0 11.8*  HCT 38.4 34.7*  MCV 91.9 92.0  PLT 176 150     Cardiac Enzymes:  Recent Labs  10/13/12 1428 10/13/12 2027 10/14/12 0240  TROPONINI <0.30 <0.30 <0.30      Hemoglobin A1C:  Recent Labs  10/13/12 1629  HGBA1C 5.0    Fasting Lipid Panel:  Recent Labs  10/14/12 0240  CHOL 145  HDL 56  LDLCALC 76  TRIG 65  CHOLHDL 2.6    Thyroid Function Tests:  Recent Labs  10/13/12 1629  TSH 2.297      Coagulation:  Recent Labs  10/13/12 1013  INR 1.06    Urine Drug Screen: Drugs of Abuse     Component Value Date/Time   LABOPIA NONE DETECTED 10/13/2012 1350   COCAINSCRNUR NONE DETECTED 10/13/2012 1350   LABBENZ NONE DETECTED 10/13/2012 1350  AMPHETMU NONE DETECTED 10/13/2012 1350   THCU POSITIVE* 10/13/2012 1350   LABBARB NONE DETECTED 10/13/2012 1350     Imaging results:  Dg Chest Portable 1 View  10/13/2012   CLINICAL DATA:  42 year old female with chest pain shortness of Breath.  EXAM: PORTABLE CHEST - 1 VIEW  COMPARISON:  10/25/2011 and earlier.  FINDINGS: AP portable view at 1008 hr. Insert contour stable lung volumes. Allowing for portable technique, the lungs are clear. Stable visualized osseous structures.  IMPRESSION: No acute cardiopulmonary abnormality.   Electronically Signed   By: Augusto Gamble   On: 10/13/2012 10:12    Other results: EKG 8/22: Sinus bradycardia, rate 57; otherwise normal EKG EKG 8/23: Sinus bradycardia, rate 53; otherwise normal EKG   Assessment & Plan by Problem:  1.  Chest pain.  There is no evidence of acute coronary syndrome by  enzymes or EKGs.  Given the associated right costochondral tenderness with reproduction of chest pain on palpation and low risk score, a cardiac source of pain is unlikely.  However, this is the second hospitalization for chest pain within the past year, and referral to cardiology for consideration of a stress study as outpatient would be reasonable.  Plan is oral analgesic (avoid NSAID medications given history of reflux); schedule outpatient cardiology followup next week.  2.  GERD.  Plan is empiric PPI, with further UGI evaluation if symptoms persist.  3.  Other problems and plans as per the resident physician's note.

## 2012-10-14 NOTE — Discharge Summary (Addendum)
Name: Susan Wang MRN: [id number] DOB: 1971-02-04 42 y.o. PCP: Pcp Not In System  Date of Admission: 10/13/2012  9:44 AM Date of Discharge: 10/14/2012 Attending Physician: Jonah Blue, DO  Discharge Diagnosis: 1. Chest pain 2. GERD (gastroesophageal reflux disease) 3. Costochondritis, acute  Discharge Medications:   Medication List         albuterol 108 (90 BASE) MCG/ACT inhaler  Commonly known as:  PROVENTIL HFA;VENTOLIN HFA  Inhale 2 puffs into the lungs every 6 (six) hours as needed for wheezing.     HYDROcodone-acetaminophen 5-325 MG per tablet  Commonly known as:  NORCO/VICODIN  Take 1 tablet by mouth every 6 (six) hours as needed for pain.     pantoprazole 40 MG tablet  Commonly known as:  PROTONIX  Take 1 tablet (40 mg total) by mouth daily.        Disposition and follow-up:   Susan Wang was discharged from [facility] in Good condition.  At the hospital follow up visit please address:  1.  Chest Pain, Costochonditits, GERD  2.  Labs / imaging needed at time of follow-up: Cardiac Stress Test  3.  Pending labs/ test needing follow-up: None  Follow-up Appointments:  I have sent a message to our clinic staff to make you an appointment with the [facility] Medicine Outpatient Clinic on Monday 10/14/2012. The Staff will call you with the information for you appointment time. It is located at [address] in [place], [place]. If they do not call you, please make an appointment to see a physician early next week.  We will make an appointment with [facility] to see you in the next two weeks to evaluate your chest pain and the need for a cardiac stress test.  Discharge Instructions: Discharge Orders   Future Orders Complete By Expires   Call MD for:  difficulty breathing, headache or visual disturbances  As directed    Call MD for:  extreme fatigue  As directed    Call MD for:  severe uncontrolled pain  As directed    Diet - low  sodium heart healthy  As directed    Discharge instructions  As directed    Comments:     You have been diagnosed with costochondritis and GERD. We have prescribed you pantoprazole for GERD and Vicodin for costochondritis. Please do not take NSAID medications such as aspirin, ibuprofen, aleve, or goody powder.   Increase activity slowly  As directed      You have been diagnosed with costochondritis and GERD. We have prescribed you pain medication (Vicodin) to treat the costochondritis. We have prescribed you pantoprazole to treat your GERD. Costochondritis    Costochondritis (Tietze syndrome), or costochondral separation, is a swelling and irritation (inflammation) of the tissue (cartilage) that connects your ribs with your breastbone (sternum). It may occur on its own (spontaneously), through damage caused by an accident (trauma), or simply from coughing or minor exercise. It may take up to 6 weeks to get better and longer if you are unable to be conservative in your activities.  HOME CARE INSTRUCTIONS  Avoid exhausting physical activity. Try not to strain your ribs during normal activity. This would include any activities using chest, belly (abdominal), and side muscles, especially if heavy weights are used.  Use ice for 15-20 minutes per hour while awake for the first 2 days. Place the ice in a plastic bag, and place a towel between the bag of ice and your skin.  Only take over-the-counter or prescription medicines for pain, discomfort, or fever as directed by your caregiver. SEEK IMMEDIATE MEDICAL CARE IF:  Your pain increases or you are very uncomfortable.  You have a fever.  You develop difficulty with your breathing.  You cough up blood.  You develop worse chest pains, shortness of breath, sweating, or vomiting.  You develop new, unexplained problems (symptoms). MAKE SURE YOU:  Understand these instructions.  Will watch your condition.  Will get help right away if you are not doing  well or get worse. Document Released: 11/18/2004 Document Revised: 05/03/2011 Document Reviewed: 09/27/2007  [facility] Patient Information 2014 [place], [place].  Gastroesophageal Reflux Disease, Adult    Gastroesophageal reflux disease (GERD) happens when acid from your stomach flows up into the esophagus. When acid comes in contact with the esophagus, the acid causes soreness (inflammation) in the esophagus. Over time, GERD may create small holes (ulcers) in the lining of the esophagus.  CAUSES  Increased body weight. This puts pressure on the stomach, making acid rise from the stomach into the esophagus.  Smoking. This increases acid production in the stomach.  Drinking alcohol. This causes decreased pressure in the lower esophageal sphincter (valve or ring of muscle between the esophagus and stomach), allowing acid from the stomach into the esophagus.  Late evening meals and a full stomach. This increases pressure and acid production in the stomach.  A malformed lower esophageal sphincter. Sometimes, no cause is found.  SYMPTOMS  Burning pain in the lower part of the mid-chest behind the breastbone and in the mid-stomach area. This may occur twice a week or more often.  Trouble swallowing.  Sore throat.  Dry cough.  Asthma-like symptoms including chest tightness, shortness of breath, or wheezing. DIAGNOSIS  Your caregiver may be able to diagnose GERD based on your symptoms. In some cases, X-rays and other tests may be done to check for complications or to check the condition of your stomach and esophagus.  TREATMENT  Your caregiver may recommend over-the-counter or prescription medicines to help decrease acid production. Ask your caregiver before starting or adding any new medicines.  HOME CARE INSTRUCTIONS  Change the factors that you can control. Ask your caregiver for guidance concerning weight loss, quitting smoking, and alcohol consumption.  Avoid foods and drinks that make your  symptoms worse, such as:  Caffeine or alcoholic drinks.  Chocolate.  Peppermint or mint flavorings.  Garlic and onions.  Spicy foods.  Citrus fruits, such as oranges, lemons, or limes.  Tomato-based foods such as sauce, chili, salsa, and pizza.  Fried and fatty foods.  Avoid lying down for the 3 hours prior to your bedtime or prior to taking a nap.  Eat small, frequent meals instead of large meals.  Wear loose-fitting clothing. Do not wear anything tight around your waist that causes pressure on your stomach.  Raise the head of your bed 6 to 8 inches with wood blocks to help you sleep. Extra pillows will not help.  Only take over-the-counter or prescription medicines for pain, discomfort, or fever as directed by your caregiver.  Do not take aspirin, ibuprofen, or other nonsteroidal anti-inflammatory drugs (NSAIDs). SEEK IMMEDIATE MEDICAL CARE IF:  You have pain in your arms, neck, jaw, teeth, or back.  Your pain increases or changes in intensity or duration.  You develop nausea, vomiting, or sweating (diaphoresis).  You develop shortness of breath, or you faint.  Your vomit is green, yellow, black, or looks like coffee grounds or blood.  Your stool is red, bloody, or black. These symptoms could be signs of other problems, such as heart disease, gastric bleeding, or esophageal bleeding.  MAKE SURE YOU:  Understand these instructions.  Will watch your condition.  Will get help right away if you are not doing well or get worse. Document Released: 11/18/2004 Document Revised: 05/03/2011 Document Reviewed: 08/28/2010  [facility] Patient Information 2014 [place], [place].    Procedures Performed:  Dg Chest Portable 1 View  10/13/2012   CLINICAL DATA:  42 year old female with chest pain shortness of Breath.  EXAM: PORTABLE CHEST - 1 VIEW  COMPARISON:  10/25/2011 and earlier.  FINDINGS: AP portable view at 1008 hr. Insert contour stable lung volumes. Allowing for portable technique, the lungs  are clear. Stable visualized osseous structures.  IMPRESSION: No acute cardiopulmonary abnormality.   Electronically Signed   By: Augusto Gamble   On: 10/13/2012 10:12   Admission HPI:   Susan Wang is a 42 y.o. female (TIMI=0, PERC negative) with a pmhx of arthritis, tobacco abuse s/p cessation 2 years ago, and chest pain associated with anxiety/stress who presents to the ED with a CC of chest pain. The patient describes that she experiences chest tightness with exertion for the last 5 years. She states that she feels a fluttering sensation when she exerts herself while walking to work. This morning, the chest pain was worse than had been normal for her. Furthermore, the pain has been ongoing since this morning, while it normally is alleviated by rest. The pain has a pressure like quality and radiates to her back. She says that the pain felt like gas, and when she burped it got much better, but returned a few minutes later. She describes a burning chest pain when she lies down at night associated with a bad taste in her mouth, which is made worse with large meals before bedtime. The pain is made worse with deep inspiration, and it is reproducible with palpation of the sternum.   The patient also admits to being under a lot of stress as she is living with her fiancee in a foreclosed house that was recently bought. Last year, the patient ended up homeless after losing her home, and she is afraid that this may happen again.   Of note, the patient had a similar episode of chest pain in September of 2013. Cardiac Enzymes and EKG were negative for ACS and the patients symptoms resolved with nebs at that encounter. The team at that time thought this pain was likely secondary to bronchitis.  Hospital Course by problem list: Principal Problem:   Chest pain Active Problems:   GERD (gastroesophageal reflux disease)   Costochondritis, acute   1. Chest Pain The patients chest pain is likely secondary to MSK  pain(costochondritis) and GERD. It is also likely that there is a anxiety component. The exertional dyspnea/fluttering component of her chest pain is unlikely due to a cardiac cause (TIMI=0), but could not be fully ruled out in the ED. As this is her second hospitalization and she does not have a PCP, the patient was admitted for observation, cycling troponins, and a possible stress test. As cardiac enzymes were negative and EKG demonstrated no changes, ACS has been ruled out. PE is unlikely as the patient was ruled out with PERC. PNA is unlikely as patient has stable vital signs, no current cough, and sinus bradycardia on EKG. The patient's exertional chest pain remains cocnering for angina or exercise induced ischemia. The patient had been scheduled  for follow up with a cardiologist for evaluation and potential stress test.  2. GERD The patients burning chest pain is likely secondary to GERD. We prescribed the patient pantoprazole qd. The patient plans to f/u as outpatient for this problem.  3. Costochondritis The patients chest wall pain that is reproducible with palpation is likely secondary to costochondritis. The patients was treated with morphine IV in the hospital and was prescribed vicodin (short course) on discharge. NSAIDs were avoided as patient expresses significant GERD like symptoms. Patient plans to f/u as outpatient with OPC.   Discharge Vitals:   BP 121/66  Pulse 56  Temp(Src) 98.1 F (36.7 C) (Oral)  Resp 20  Ht 5' (1.524 m)  Wt 197 lb (89.359 kg)  BMI 38.47 kg/m2  SpO2 98%  LMP 10/06/2012  Discharge Labs:  Results for orders placed during the hospital encounter of 10/13/12 (from the past 24 hour(s))  URINE RAPID DRUG SCREEN (HOSP PERFORMED)     Status: Abnormal   Collection Time    10/13/12  1:50 PM      Result Value Range   Opiates NONE DETECTED  NONE DETECTED   Cocaine NONE DETECTED  NONE DETECTED   Benzodiazepines NONE DETECTED  NONE DETECTED   Amphetamines NONE  DETECTED  NONE DETECTED   Tetrahydrocannabinol POSITIVE (*) NONE DETECTED   Barbiturates NONE DETECTED  NONE DETECTED  POCT I-STAT TROPONIN I     Status: None   Collection Time    10/13/12  1:57 PM      Result Value Range   Troponin i, poc 0.01  0.00 - 0.08 ng/mL   Comment 3           TROPONIN I     Status: None   Collection Time    10/13/12  2:28 PM      Result Value Range   Troponin I <0.30  <0.30 ng/mL  MAGNESIUM     Status: None   Collection Time    10/13/12  4:29 PM      Result Value Range   Magnesium 2.2  1.5 - 2.5 mg/dL  TSH     Status: None   Collection Time    10/13/12  4:29 PM      Result Value Range   TSH 2.297  0.350 - 4.500 uIU/mL  HEMOGLOBIN A1C     Status: None   Collection Time    10/13/12  4:29 PM      Result Value Range   Hemoglobin A1C 5.0  <5.7 %   Mean Plasma Glucose 97  <117 mg/dL  LIPASE, BLOOD     Status: None   Collection Time    10/13/12  4:29 PM      Result Value Range   Lipase 13  11 - 59 U/L  TROPONIN I     Status: None   Collection Time    10/13/12  8:27 PM      Result Value Range   Troponin I <0.30  <0.30 ng/mL  TROPONIN I     Status: None   Collection Time    10/14/12  2:40 AM      Result Value Range   Troponin I <0.30  <0.30 ng/mL  BASIC METABOLIC PANEL     Status: None   Collection Time    10/14/12  2:40 AM      Result Value Range   Sodium 137  135 - 145 mEq/L   Potassium 3.5  3.5 - 5.1 mEq/L  Chloride 106  96 - 112 mEq/L   CO2 23  19 - 32 mEq/L   Glucose, Bld 99  70 - 99 mg/dL   BUN 12  6 - 23 mg/dL   Creatinine, Ser 3.08  0.50 - 1.10 mg/dL   Calcium 8.4  8.4 - 65.7 mg/dL   GFR calc non Af Amer >90  >90 mL/min   GFR calc Af Amer >90  >90 mL/min  CBC     Status: Abnormal   Collection Time    10/14/12  2:40 AM      Result Value Range   WBC 5.5  4.0 - 10.5 K/uL   RBC 3.77 (*) 3.87 - 5.11 MIL/uL   Hemoglobin 11.8 (*) 12.0 - 15.0 g/dL   HCT 84.6 (*) 96.2 - 95.2 %   MCV 92.0  78.0 - 100.0 fL   MCH 31.3  26.0 - 34.0 pg     MCHC 34.0  30.0 - 36.0 g/dL   RDW 84.1  32.4 - 40.1 %   Platelets 150  150 - 400 K/uL  LIPID PANEL     Status: None   Collection Time    10/14/12  2:40 AM      Result Value Range   Cholesterol 145  0 - 200 mg/dL   Triglycerides 65  <027 mg/dL   HDL 56  >25 mg/dL   Total CHOL/HDL Ratio 2.6     VLDL 13  0 - 40 mg/dL   LDL Cholesterol 76  0 - 99 mg/dL    Signed: Pleas Koch, MD 10/14/2012, 11:48 AM   Time Spent on Discharge: 30 minutes Services Ordered on Discharge: None Equipment Ordered on Discharge: None

## 2012-10-16 NOTE — H&P (Signed)
  I have seen and examined the patient, and reviewed the daily progress note by Gretchen Short, MS IV and discussed the care of the patient with them. Please see my progress note from 10/16/2012 for further details regarding assessment and plan.    Signed:  Pleas Koch, MD 10/16/2012, 7:50 AM

## 2012-10-18 NOTE — Discharge Summary (Signed)
There was no evidence of acute coronary syndrome by enzymes or EKGs. Given the associated right costochondral tenderness with reproduction of chest pain on palpation and low risk score, a cardiac source of pain was felt to be unlikely. However, since this was the second hospitalization for chest pain within the past year, referral to cardiology for consideration of a stress study as outpatient is appropriate.

## 2012-10-19 ENCOUNTER — Ambulatory Visit (INDEPENDENT_AMBULATORY_CARE_PROVIDER_SITE_OTHER): Payer: Self-pay | Admitting: Internal Medicine

## 2012-10-19 ENCOUNTER — Encounter: Payer: Self-pay | Admitting: Internal Medicine

## 2012-10-19 VITALS — BP 134/79 | HR 72 | Temp 97.2°F | Ht 60.0 in | Wt 190.0 lb

## 2012-10-19 DIAGNOSIS — R079 Chest pain, unspecified: Secondary | ICD-10-CM

## 2012-10-19 DIAGNOSIS — M940 Chondrocostal junction syndrome [Tietze]: Secondary | ICD-10-CM

## 2012-10-19 DIAGNOSIS — R002 Palpitations: Secondary | ICD-10-CM | POA: Insufficient documentation

## 2012-10-19 NOTE — Progress Notes (Addendum)
Patient ID: Susan Wang, female   DOB: 04-26-1970, 42 y.o.   MRN: [id number]   Subjective:   HPI: [name] is a 42 y.o. with past medical history of GERD, degenerative joint disease, presents to the clinic for hospital followup visit.  She was discharged from the hospital 10/14/2012 with a negative cardiac workup for chest pain with a plan for EST as outpatient.  She reports that since returning home, she has continued to experience chest pain, which happens whenever she goes back to work. Her work involves lifting heavy objects. She works as a porter. She denies shortness of breath, dizziness, diaphoresis, PND, orthopnea, or swelling of her extremities. She denies exertional component to her chest pain. She was unable to refill her prescription of Vicodin due to cost. The chest pain is not present when she is at home.  However, she also gives a further history of palpitations, which have been present for about 2  years. They tend to happen every morning, but not associated with dizziness, no syncope epsisodes.No SOB. She describes sensations of skipping beats of her heart. This issue will was not raised during hospitalization. She denies heat or cold intolerance. Her recent TSH was normal.  Patient is a former smoker. She quit 2 years ago. Family history is nonsignificant for coronary artery disease or strokes. Her most recent LDL, was 76.   Patient is also here to apply for orange card. She is currently without insurance and she has significant challenges affording her medications and doctor's visits.    Past Medical History  Diagnosis Date  . Arthritis   . DJD (degenerative joint disease)   . Osteoarthritis     knees  . Bronchitis   . Plantar fasciitis   . Chest pain 09/2012  . Anxiety   . Shortness of breath   . Carpal tunnel syndrome    Current Outpatient Prescriptions  Medication Sig Dispense Refill  . albuterol (PROVENTIL HFA;VENTOLIN HFA) 108 (90 BASE) MCG/ACT  inhaler Inhale 2 puffs into the lungs every 6 (six) hours as needed for wheezing.  1 Inhaler  0  . HYDROcodone-acetaminophen (NORCO/VICODIN) 5-325 MG per tablet Take 1 tablet by mouth every 6 (six) hours as needed for pain.  15 tablet  0  . pantoprazole (PROTONIX) 40 MG tablet Take 1 tablet (40 mg total) by mouth daily.  30 tablet  0   No current facility-administered medications for this visit.   Family History  Problem Relation Age of Onset  . Diabetes Father   . Other Mother   . Healthy Sister   . Healthy Brother    History   Social History  . Marital Status: Single    Spouse Name: N/A    Number of Children: N/A  . Years of Education: N/A   Social History Main Topics  . Smoking status: Former Smoker -- 1.00 packs/day for 13 years    Types: Cigars    Quit date: 05/14/2010  . Smokeless tobacco: Never Used  . Alcohol Use: No     Comment: occasionally  . Drug Use: Yes    Special: Marijuana  . Sexual Activity: None   Other Topics Concern  . None   Social History Narrative   Lives with her [place] in [place]. She has a job.    Review of Systems: Constitutional: Denies fever, chills, diaphoresis, appetite change and fatigue.  Respiratory: Denies SOB, DOE, cough, chest tightness, and wheezing.  Gastrointestinal: No abdominal pain, nausea, vomiting, bloody stools Genitourinary: No dysuria,  frequency, hematuria, or flank pain.  Musculoskeletal: No myalgias, back pain, joint swelling, arthralgias    Objective:  Physical Exam: Filed Vitals:   10/19/12 1335  BP: 134/79  Pulse: 72  Temp: 97.2 F (36.2 C)  TempSrc: Oral  Height: 5' (1.524 m)  Weight: 190 lb (86.183 kg)  SpO2: 99%   General: Well nourished. No acute distress.  Lungs: CTA bilaterally. Right chest wall tenderness without obvious lesions. Heart: RRR; no extra sounds or murmurs  Abdomen: Non-distended, normal BS, soft, nontender; no hepatosplenomegaly  Extremities: No pedal edema. No joint swelling or  tenderness. Neurologic: Alert and oriented x3. No obvious neurologic deficits.  Assessment & Plan:  I have discussed my assessment and plan  with Dr. Dalphine Handing  as detailed under problem based charting.

## 2012-10-19 NOTE — Patient Instructions (Signed)
We will get orange card before we can get some tests done for you in case of your palpitations and chest pain I have attached the following paper work for you regarding the orange card but you will need to get in touch with our financial  Counselor [employer].  Paperwork that needs to be turned in to [employer] FOR ORANGE CARD ELIGIBILITY APPLICATION   1. Picture ID (Can't be expired) 2. Current Bill to establish proof of residency 3. W-2 & Tax return (if self-employed include "Schedule C"), if not filing Form 4506 4. 4 current Pay stubs for this year 5. Printout of other income (Social security, unemployment, child support, workmen's comp) 6. Food stamp award letter, if receiving  7. Life Insurance (Need copy of the front page, showing name Ins Co. Name, and face amount). 8. Statement for pension, 401-K, IRS (needs to have current balance) 9. Tax Value for cars, houses, mobile homes, and land (Get from [facility] Tax Department) 10. Disability Paperwork (showing status of case) 11. College students: Print out of [place] received, tuition cost, books, etc. 12. If no Income: Engineer, maintenance of support for free shelter, money, food, Catering manager.  Bring all that you can to your follow up appointment to start the process.

## 2012-10-19 NOTE — Assessment & Plan Note (Signed)
Patient's chest pain is unlikely to be cardiac related. It is most likely musculoskeletal. Unfortunately, the patient is unable to do EST as outpatient and due to lack of insurance. I have emphasized to her to bring her application for orange card today to [facility].   Plan  - Will pursue further evaluation once she gets orange card. - Encourage her to fill her prescription of Vicodin. - Reassured her that her chest pain is most likely musculoskeletal.

## 2012-10-19 NOTE — Assessment & Plan Note (Signed)
Patient's description of palpitations with skipped beats, raises concerns of symptomatic PVCs or other cardiac arrhythmias. TSH is normal. Ideally, she would require Holter monitor for at least 48 hours. Unfortunately, the patient is currently can't afford this test at this time.  Plan. -Will consider Holter monitor if the symptoms persist, and after she gets the orange card. - I advised her to call 911 if she develops dizziness, chest pain, shortness of breath, or syncopal episode with palpitations.

## 2012-10-20 ENCOUNTER — Ambulatory Visit: Payer: Self-pay

## 2012-10-24 NOTE — Progress Notes (Signed)
Case discussed with Dr. Kazibwe soon after the resident saw the patient.  We reviewed the resident's history and exam and pertinent patient test results.  I agree with the assessment, diagnosis, and plan of care documented in the resident's note. 

## 2012-10-25 ENCOUNTER — Encounter: Payer: Self-pay | Admitting: *Deleted

## 2012-10-27 ENCOUNTER — Ambulatory Visit (INDEPENDENT_AMBULATORY_CARE_PROVIDER_SITE_OTHER): Payer: Self-pay | Admitting: Cardiovascular Disease

## 2012-10-27 ENCOUNTER — Encounter: Payer: Self-pay | Admitting: Cardiovascular Disease

## 2012-10-27 VITALS — BP 133/89 | HR 62 | Ht 60.0 in | Wt 193.6 lb

## 2012-10-27 DIAGNOSIS — R079 Chest pain, unspecified: Secondary | ICD-10-CM

## 2012-10-27 NOTE — Assessment & Plan Note (Signed)
Susan Wang  was referred here by the internal medicine service for a stress test.  She has been admitted to the hospital on several different occasions with chest pains. The chest is a very atypical. She clearly has costochondritis and palpation of her chest completely reproduces the pain.  Her job is quite physical. 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  We'll schedule her for a treadmill test.  Her pretest probability is extremely low. She does have a history of smoking in the past.  We'll see her on an as-needed basis. She'll followup with her general medical Dr. for costochondritis.

## 2012-10-27 NOTE — Patient Instructions (Addendum)
Your physician has requested that you have an exercise tolerance test. Please also follow instruction sheet, as given.   Your physician recommends that you schedule a follow-up appointment in: as needed basis

## 2012-10-27 NOTE — Progress Notes (Signed)
Susan Wang Date of Birth  09/02/1970       [facility] Office [address]  [address] [place], [place]  [address]   [place], [place]  [address] [phone]     [phone]   Fax  [phone]    Fax [phone]  Problem List: 1. Chest pain- thought to be due to costochondritis  History of Present Illness:  Susan Wang is a 42 yo who has been admitted to the [facility] hospital with chest pain.  Dull ache; across her chest.  The pain was fairly constant - would last for many hours at a time.   There was a pleuretic component to the pain.  D-dimer has been negative.  She was admitted to [facility] hospital.  She ruled out for myocardial infarction. D-dimer was negative.  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  She does lots of physical exertion during the course of her work day.  She has occasional   Current Outpatient Prescriptions on File Prior to Visit  Medication Sig Dispense Refill  . albuterol (PROVENTIL HFA;VENTOLIN HFA) 108 (90 BASE) MCG/ACT inhaler Inhale 2 puffs into the lungs every 6 (six) hours as needed for wheezing.  1 Inhaler  0  . HYDROcodone-acetaminophen (NORCO/VICODIN) 5-325 MG per tablet Take 1 tablet by mouth every 6 (six) hours as needed for pain.  15 tablet  0  . pantoprazole (PROTONIX) 40 MG tablet Take 1 tablet (40 mg total) by mouth daily.  30 tablet  0   No current facility-administered medications on file prior to visit.    Allergies  Allergen Reactions  . Other Itching and Swelling    "Any melon"     Past Medical History  Diagnosis Date  . Arthritis   . DJD (degenerative joint disease)   . Osteoarthritis     knees  . Bronchitis   . Plantar fasciitis   . Chest pain 09/2012  . Anxiety   . Shortness of breath   . Carpal tunnel syndrome     Past Surgical History  Procedure Laterality Date  . Cholecystectomy      History  Smoking status  . Former Smoker -- 1.00 packs/day for 13  years  . Types: Cigars  . Quit date: 05/14/2010  Smokeless tobacco  . Never Used    History  Alcohol Use No    Comment: occasionally    Family History  Problem Relation Age of Onset  . Diabetes Father   . Other Mother   . Healthy Sister   . Healthy Brother     Reviw of Systems:  Reviewed in the HPI.  All other systems are negative.  Physical Exam: Blood pressure 133/89, pulse 62, height 5' (1.524 m), weight 193 lb 9.6 oz (87.816 kg), last menstrual period 10/06/2012. General: Well developed, well nourished, in no acute distress.  Head: Normocephalic, atraumatic, sclera non-icteric, mucus membranes are moist,   Neck: Supple. Carotids are 2 + without bruits. No JVD   Lungs: Clear   Heart: RR, normal S1, S2.  She has definite rib tenderness with palpitations  Abdomen: Soft, non-tender, non-distended with normal bowel sounds.  Mild obesity  Msk:  Strength and tone are normal   Extremities: No clubbing or cyanosis. No edema.  Distal pedal pulses are 2+ and equal    Neuro: CN II - XII intact.  Alert and oriented X 3.   Psych:  Normal  ECG: Aug. 23, 2014:  NSR, no ST or T wave changes.   Assessment / Plan:

## 2012-11-01 ENCOUNTER — Encounter: Payer: Self-pay | Admitting: Internal Medicine

## 2012-11-01 ENCOUNTER — Ambulatory Visit (INDEPENDENT_AMBULATORY_CARE_PROVIDER_SITE_OTHER): Payer: Self-pay | Admitting: Internal Medicine

## 2012-11-01 VITALS — BP 124/81 | HR 61 | Temp 97.8°F | Ht 60.0 in | Wt 191.6 lb

## 2012-11-01 DIAGNOSIS — G5603 Carpal tunnel syndrome, bilateral upper limbs: Secondary | ICD-10-CM | POA: Insufficient documentation

## 2012-11-01 DIAGNOSIS — Z0000 Encounter for general adult medical examination without abnormal findings: Secondary | ICD-10-CM | POA: Insufficient documentation

## 2012-11-01 DIAGNOSIS — M722 Plantar fascial fibromatosis: Secondary | ICD-10-CM | POA: Insufficient documentation

## 2012-11-01 DIAGNOSIS — G5600 Carpal tunnel syndrome, unspecified upper limb: Secondary | ICD-10-CM

## 2012-11-01 DIAGNOSIS — J40 Bronchitis, not specified as acute or chronic: Secondary | ICD-10-CM

## 2012-11-01 MED ORDER — NAPROXEN 375 MG PO TABS: 375.0000 mg | ORAL_TABLET | Freq: Two times a day (BID) | ORAL | Status: DC

## 2012-11-01 NOTE — Patient Instructions (Signed)
Please take Naproxen for two weeks for your plantar fascitis  Performing of stretching exercises for the plantar fascia and calf muscles, which the patient can do at home. Avoiding the use of flat shoes and barefoot walking. You can try to get prefabricated, over-the-counter, silicone heel shoe inserts (arch supports and/or heel cups). Decrease physical activities can cause or aggravate your pain (eg, excessive running, walking, dancing, or jumping). Please follow up with Dr Eden Emms for your heart test Please come back to see me on three months.      Plantar Fasciitis Plantar fasciitis is a common condition that causes foot pain. It is soreness (inflammation) of the band of tough fibrous tissue on the bottom of the foot that runs from the heel bone (calcaneus) to the ball of the foot. The cause of this soreness may be from excessive standing, poor fitting shoes, running on hard surfaces, being overweight, having an abnormal walk, or overuse (this is common in runners) of the painful foot or feet. It is also common in aerobic exercise dancers and ballet dancers. SYMPTOMS  Most people with plantar fasciitis complain of:  Severe pain in the morning on the bottom of their foot especially when taking the first steps out of bed. This pain recedes after a few minutes of walking.  Severe pain is experienced also during walking following a long period of inactivity.  Pain is worse when walking barefoot or up stairs DIAGNOSIS   Your caregiver will diagnose this condition by examining and feeling your foot.  Special tests such as X-rays of your foot, are usually not needed. PREVENTION   Consult a sports medicine professional before beginning a new exercise program.  Walking programs offer a good workout. With walking there is a lower chance of overuse injuries common to runners. There is less impact and less jarring of the joints.  Begin all new exercise programs slowly. If problems or pain  develop, decrease the amount of time or distance until you are at a comfortable level.  Wear good shoes and replace them regularly.  Stretch your foot and the heel cords at the back of the ankle (Achilles tendon) both before and after exercise.  Run or exercise on even surfaces that are not hard. For example, asphalt is better than pavement.  Do not run barefoot on hard surfaces.  If using a treadmill, vary the incline.  Do not continue to workout if you have foot or joint problems. Seek professional help if they do not improve. HOME CARE INSTRUCTIONS   Avoid activities that cause you pain until you recover.  Use ice or cold packs on the problem or painful areas after working out.  Only take over-the-counter or prescription medicines for pain, discomfort, or fever as directed by your caregiver.  Soft shoe inserts or athletic shoes with air or gel sole cushions may be helpful.  If problems continue or become more severe, consult a sports medicine caregiver or your own health care provider. Cortisone is a potent anti-inflammatory medication that may be injected into the painful area. You can discuss this treatment with your caregiver. MAKE SURE YOU:   Understand these instructions.  Will watch your condition.  Will get help right away if you are not doing well or get worse. Document Released: 11/03/2000 Document Revised: 05/03/2011 Document Reviewed: 01/03/2008 [facility] Patient Information 2014 [place], [place].

## 2012-11-01 NOTE — Progress Notes (Addendum)
Patient ID: Susan Wang, female   DOB: 1971-01-13, 42 y.o.   MRN: [id number]   Subjective:   HPI: [name] is a 42 y.o. with a past medical history of recurrent bronchitis, anxiety, carpal tunnel syndrome, and osteoarthritis, chest pain, likely musculoskeletal, presents to the clinic with complaints of pain in the sole of his left foot.   Left foot pain  She describes her pain as sharp to a scale of 8-7/10. Nonradiating. Symptoms have been present for over one year. She reports that the trigger of her pain was a 43 minutes walk every day to her work due to lack of transportation. Since then she has had persistent pain in both feet, but mainly the left foot. The pain is exacerbated by standing up and walking. Somehow relieved by rest. The pain is also present during nighttime, but does not wake her up. She has been told by her friends that she has plantar fasciitis. She has not tried anything to treat this pains. She denies any symptoms of tingling, numbness, weakness or parasthesia involving any of her lower extremities.  Bilateral Carpal tunnel syndrome Patient reports that she has a known history of carpal tunnel syndrome for over 20 years. Right hand, which is her dominant hand, is more severe than the left. She has been noncompliant with the splints in the past. She reports increased tingling and numbness involving the 3 lateral fingers. The symptoms are worsened by repetitive use. She also reports history of dropping objects with a weak grip especially involving the right hand. The symptoms have not worsened lately.   Further history, reveals that the patient uses about 8 cups of coffee daily. Her cardiologist has advised her that she should cut back on her coffee intake since this might be the cause of her palpitations. She reports ongoing chest pains, which are triggered by lifting objects during her work. She is scheduled to have an EST on 11/27/2012 by Dr Eden Emms.  Kindly see the A&P  for the status of the pt's chronic medical problems.    Past Medical History  Diagnosis Date  . Arthritis   . DJD (degenerative joint disease)   . Osteoarthritis     knees  . Bronchitis   . Plantar fasciitis   . Chest pain 09/2012  . Anxiety   . Shortness of breath   . Carpal tunnel syndrome    Current Outpatient Prescriptions  Medication Sig Dispense Refill  . albuterol (PROVENTIL HFA;VENTOLIN HFA) 108 (90 BASE) MCG/ACT inhaler Inhale 2 puffs into the lungs every 6 (six) hours as needed for wheezing.  1 Inhaler  0  . HYDROcodone-acetaminophen (NORCO/VICODIN) 5-325 MG per tablet Take 1 tablet by mouth every 6 (six) hours as needed for pain.  15 tablet  0  . naproxen (NAPROSYN) 375 MG tablet Take 1 tablet (375 mg total) by mouth 2 (two) times daily with a meal.  42 tablet  0  . pantoprazole (PROTONIX) 40 MG tablet Take 1 tablet (40 mg total) by mouth daily.  30 tablet  0   No current facility-administered medications for this visit.   Family History  Problem Relation Age of Onset  . Diabetes Father   . Other Mother   . Healthy Sister   . Healthy Brother    History   Social History  . Marital Status: Single    Spouse Name: N/A    Number of Children: N/A  . Years of Education: N/A   Social History Main Topics  .  Smoking status: Former Smoker -- 1.00 packs/day for 13 years    Types: Cigars    Quit date: 05/14/2010  . Smokeless tobacco: Never Used  . Alcohol Use: No     Comment: occasionally  . Drug Use: Yes    Special: Marijuana  . Sexual Activity: None   Other Topics Concern  . None   Social History Narrative   Lives with her Paisley in [place]. She has a job.    Review of Systems: Constitutional: Denies fever, chills, diaphoresis, appetite change and fatigue.  Respiratory: Denies SOB, DOE, cough, chest tightness, and wheezing.  Gastrointestinal: No abdominal pain, nausea, vomiting, bloody stools Genitourinary: No dysuria, frequency, hematuria, or flank  pain.  Musculoskeletal: No myalgias, back pain, joint swelling, arthralgias . Bilateral foot and hand pain as noted in HPI.  Objective:  Physical Exam: Filed Vitals:   11/01/12 1538  BP: 124/81  Pulse: 61  Temp: 97.8 F (36.6 C)  TempSrc: Oral  Height: 5' (1.524 m)  Weight: 191 lb 9.6 oz (86.909 kg)  SpO2: 100%   General: Well nourished. No acute distress.  Lungs: CTA bilaterally. Heart: RRR; no extra sounds or murmurs  Abdomen: Non-distended, normal BS, soft, nontender; no hepatosplenomegaly  Extremities: No pedal edema. No joint swelling or tenderness. Left foot tenderness on gentle squeeze from the sides of the foot. Pain is localized in the hind-foot. Forefoot without pain. Pulses are present. Skin looks normal with a normal hair. UL: Positive Tinnel and Phanell tests bilaterally. Numbness increased in right hand in median nerve dermatome.  Neurologic: Alert and oriented x3. No obvious neurologic deficits.  Assessment & Plan:  I have discussed my assessment and plan  with Dr. Aundria Rud as detailed under problem based charting.

## 2012-11-01 NOTE — Assessment & Plan Note (Signed)
Physical exam is very consistent with plantar fasciitis.   Plan Performing of stretching exercises for the plantar fascia and calf muscles, which the patient can do at home. Avoiding the use of flat shoes and barefoot walking. Using prefabricated, over-the-counter, silicone heel shoe inserts (arch supports and/or heel cups). Decreasing physical activities that which cause or aggravate the pain - walking Prescribed a 2 weeks course of naproxen

## 2012-11-01 NOTE — Assessment & Plan Note (Signed)
Medication Samples have been provided to the patient.  Drug name: Ventolin inhaler  Qty: 1  LOT: [id number]  Exp.Date: 9/15  The patient has been instructed regarding the correct time, dose, and frequency of taking this medication, including desired effects and most common side effects.   Dow Adolph, MD 11/01/2012 6:03 PM

## 2012-11-01 NOTE — Assessment & Plan Note (Signed)
Declined tetanus and flu shots Patient does not wish to have a Pap smear performed today. She will do on next visit.

## 2012-11-03 NOTE — Progress Notes (Signed)
Case discussed with Dr. Kazibwe soon after the resident saw the patient.  We reviewed the resident's history and exam and pertinent patient test results.  I agree with the assessment, diagnosis, and plan of care documented in the resident's note. 

## 2012-11-06 ENCOUNTER — Ambulatory Visit: Payer: No Typology Code available for payment source

## 2012-11-27 ENCOUNTER — Encounter: Payer: Self-pay | Admitting: Physician Assistant

## 2012-11-27 NOTE — Progress Notes (Signed)
This encounter was created in error - please disregard.

## 2012-11-27 NOTE — Progress Notes (Deleted)
Exercise Treadmill Test  Pre-Exercise Testing Evaluation Rhythm: {CHL RHYTHM BASELINE EKG FOR ETT:21021046}  Rate: {CHL RATE BASELINE EKG FOR ETT:21021048}   PR:  {CHL PR BASELINE EKG FOR ETT:21021049} QRS:  {CHL QRS BASELINE EKG FOR ETT:21021050}  QT:  {CHL QT BASELINE EKG FOR ETT:21021051} QTc: {CHL QTC BASELINE EKG FOR ETT:21021052}   P axis: {CHL AXIS BASELINE EKG FOR ETT:21021053}  QRS axis:  {CHL AXIS BASELINE EKG FOR ETT:21021053}  ST Segments:  {CHL ST SEGMENTS BASELINE EKG FOR ETT:21021054}     Test  Exercise Tolerance Test Ordering MD: {CHL LB CARDIOLOGY MD FOR ETT:21021055}  Interpreting MD: {CHL LB CARDIOLOGY INTERPRET MD FOR ETT:21020517}  Unique Test No: ***  Treadmill:  {CHL TREADMILL # FOR ETT:21021057}  Indication for ETT: {CHL INDICATION FOR ETT:21021058}  Contraindication to ETT: {CHL CONTRAINDICATION TO ETT:21021059}   Stress Modality: {CHL STRESS MODALITY FOR ETT:21021060}  Cardiac Imaging Performed: {CHL CARDIAC IMAGING PERFORMED FOR ETT:21021063}   Protocol: {CHL PROTOCOL FOR ETT:21021061}  Max BP:  ***/***  Max MPHR (bpm):  *** 85% MPR (bpm):  ***  MPHR obtained (bpm):  *** % MPHR obtained:  ***  Reached 85% MPHR (min:sec):  *** Total Exercise Time (min-sec):  ***  Workload in METS:  *** Borg Scale: ***  Reason ETT Terminated:  {CHL REASON TERMINATED FOR ETT:21021064}    ST Segment Analysis At Rest: {CHL ST SEGMENT AT REST FOR ETT:21021065} With Exercise: {CHL ST SEGMENT WITH EXERCISE FOR ETT:21021066}  Other Information Arrhythmia:  {CHL ARRHYTHMIA FOR ETT:21021070} Angina during ETT:  {CHL ANGINA DURING ETT:21021071} Quality of ETT:  {CHL QUALITY OF ETT:21021072}  ETT Interpretation:  {CHL INTERPRETATION FOR ETT:21021073}  Comments: ***  Recommendations: ***   

## 2012-11-28 ENCOUNTER — Emergency Department (HOSPITAL_COMMUNITY)
Admission: EM | Admit: 2012-11-28 | Discharge: 2012-11-28 | Disposition: A | Discharge status: Home / Self Care | Payer: No Typology Code available for payment source | Attending: Emergency Medicine | Admitting: Emergency Medicine

## 2012-11-28 ENCOUNTER — Emergency Department (HOSPITAL_COMMUNITY): Payer: No Typology Code available for payment source

## 2012-11-28 ENCOUNTER — Encounter: Payer: Self-pay | Admitting: Physician Assistant

## 2012-11-28 ENCOUNTER — Encounter (HOSPITAL_COMMUNITY): Payer: Self-pay | Admitting: Emergency Medicine

## 2012-11-28 DIAGNOSIS — Z8659 Personal history of other mental and behavioral disorders: Secondary | ICD-10-CM | POA: Insufficient documentation

## 2012-11-28 DIAGNOSIS — IMO0002 Reserved for concepts with insufficient information to code with codable children: Secondary | ICD-10-CM | POA: Insufficient documentation

## 2012-11-28 DIAGNOSIS — J069 Acute upper respiratory infection, unspecified: Secondary | ICD-10-CM | POA: Insufficient documentation

## 2012-11-28 DIAGNOSIS — Z87891 Personal history of nicotine dependence: Secondary | ICD-10-CM | POA: Insufficient documentation

## 2012-11-28 DIAGNOSIS — Z8739 Personal history of other diseases of the musculoskeletal system and connective tissue: Secondary | ICD-10-CM | POA: Insufficient documentation

## 2012-11-28 DIAGNOSIS — M1710 Unilateral primary osteoarthritis, unspecified knee: Secondary | ICD-10-CM | POA: Insufficient documentation

## 2012-11-28 DIAGNOSIS — R599 Enlarged lymph nodes, unspecified: Secondary | ICD-10-CM | POA: Insufficient documentation

## 2012-11-28 DIAGNOSIS — Z8669 Personal history of other diseases of the nervous system and sense organs: Secondary | ICD-10-CM | POA: Insufficient documentation

## 2012-11-28 HISTORY — DX: Reserved for concepts with insufficient information to code with codable children: IMO0002

## 2012-11-28 MED ORDER — IOHEXOL 300 MG/ML  SOLN
80.0000 mL | Freq: Once | INTRAMUSCULAR | Status: AC | PRN
  Administered 2012-11-28: 80 mL via INTRAVENOUS

## 2012-11-28 MED ORDER — MOMETASONE FUROATE 50 MCG/ACT NA SUSP: 2.0000 | Freq: Every day | NASAL | Status: DC

## 2012-11-28 MED ORDER — BENZONATATE 100 MG PO CAPS: 100.0000 mg | ORAL_CAPSULE | Freq: Three times a day (TID) | ORAL | Status: DC

## 2012-11-28 MED ORDER — OXYMETAZOLINE HCL 0.05 % NA SOLN: 2.0000 | Freq: Two times a day (BID) | NASAL | Status: DC

## 2012-11-28 MED ORDER — DEXAMETHASONE SODIUM PHOSPHATE 10 MG/ML IJ SOLN
10.0000 mg | Freq: Once | INTRAMUSCULAR | Status: AC
  Administered 2012-11-28: 10 mg via INTRAVENOUS
  Filled 2012-11-28: qty 1

## 2012-11-28 MED ORDER — KETOROLAC TROMETHAMINE 30 MG/ML IJ SOLN
30.0000 mg | Freq: Once | INTRAMUSCULAR | Status: AC
  Administered 2012-11-28: 30 mg via INTRAVENOUS
  Filled 2012-11-28: qty 1

## 2012-11-28 NOTE — ED Notes (Signed)
Patient transported to CT 

## 2012-11-28 NOTE — ED Notes (Signed)
Pt. reports sore throat / swelling onset 2 days ago , hard to swallow , airway intact / respirations unlabored .

## 2012-11-28 NOTE — ED Notes (Signed)
IV access attempted x2 without success. Patient indicated that generally IV access is difficult. IV team Paged.

## 2012-11-28 NOTE — ED Provider Notes (Signed)
CSN: [id number]     Arrival date & time 11/28/12  7846 History   First MD Initiated Contact with Patient 11/28/12 0402     Chief Complaint  Patient presents with  . Sore Throat   (Consider location/radiation/quality/duration/timing/severity/associated sxs/prior Treatment) HPI Patient presents with 2 days of sore throat, bilateral ear pain, nasal congestion, rhinorrhea and cough. No fever or chills. Patient is having increasing difficulty speaking and swallowing this evening. She states she is having increased anterior neck pain. She has no neck stiffness. She has no shortness of breath. She denies chest pain. Past Medical History  Diagnosis Date  . Arthritis   . DJD (degenerative joint disease)   . Osteoarthritis     knees  . Bronchitis   . Plantar fasciitis   . Chest pain 09/2012  . Anxiety   . Shortness of breath   . Carpal tunnel syndrome   . DDD (degenerative disc disease)   . Carpal tunnel syndrome    Past Surgical History  Procedure Laterality Date  . Cholecystectomy     Family History  Problem Relation Age of Onset  . Diabetes Father   . Other Mother   . Healthy Sister   . Healthy Brother    History  Substance Use Topics  . Smoking status: Former Smoker -- 1.00 packs/day for 13 years    Types: Cigars    Quit date: 05/14/2010  . Smokeless tobacco: Never Used  . Alcohol Use: No     Comment: occasionally   OB History   Grav Para Term Preterm Abortions TAB SAB Ect Mult Living                 Review of Systems  Constitutional: Negative for fever and chills.  HENT: Positive for ear pain, congestion, sore throat, rhinorrhea, trouble swallowing, neck pain and voice change. Negative for neck stiffness.   Respiratory: Positive for cough. Negative for shortness of breath and wheezing.   Cardiovascular: Negative for chest pain.  Gastrointestinal: Negative for nausea, vomiting, abdominal pain and diarrhea.  Musculoskeletal: Negative for myalgias.  Skin: Negative for  rash and wound.  Neurological: Negative for dizziness, weakness, light-headedness, numbness and headaches.  All other systems reviewed and are negative.    Allergies  Other  Home Medications   Current Outpatient Rx  Name  Route  Sig  Dispense  Refill  . albuterol (PROVENTIL HFA;VENTOLIN HFA) 108 (90 BASE) MCG/ACT inhaler   Inhalation   Inhale 2 puffs into the lungs every 6 (six) hours as needed for wheezing.   1 Inhaler   0   . aspirin-sod bicarb-citric acid (ALKA-SELTZER) 325 MG TBEF tablet   Oral   Take 325 mg by mouth every 6 (six) hours as needed.         Marland Kitchen HYDROcodone-acetaminophen (NORCO/VICODIN) 5-325 MG per tablet   Oral   Take 1 tablet by mouth every 6 (six) hours as needed for pain.   15 tablet   0    BP 124/69  Pulse 64  Temp(Src) 98.8 F (37.1 C) (Oral)  Resp 14  Ht 5' (1.524 m)  Wt 191 lb (86.637 kg)  BMI 37.3 kg/m2  SpO2 96%  LMP 11/13/2012 Physical Exam  Nursing note and vitals reviewed. Constitutional: She is oriented to person, place, and time. She appears well-developed and well-nourished. No distress.  HENT:  Head: Normocephalic and atraumatic.  Mouth/Throat: Oropharynx is clear and moist. No oropharyngeal exudate.  Bilateral nasal mucosa edema. Mildly erythematous oropharynx without any  evidence of swelling. Bilateral tonsils without exudates. Uvula is midline. No trismus. Bilateral TMs with increased fullness and redness.  Eyes: EOM are normal. Pupils are equal, round, and reactive to light.  Neck: Normal range of motion. Neck supple. No tracheal deviation present.  No meningismus. Bilateral anterior cervical lymphadenopathy with slight anterior neck fullness. Tender to palpation over the anterior neck. Trachea is midline. no stridor  Cardiovascular: Normal rate and regular rhythm.   Pulmonary/Chest: Effort normal and breath sounds normal. No stridor. No respiratory distress. She has no wheezes. She has no rales. She exhibits no tenderness.   Abdominal: Soft. Bowel sounds are normal. She exhibits no distension and no mass. There is no tenderness. There is no rebound and no guarding.  Musculoskeletal: Normal range of motion. She exhibits no edema and no tenderness.  Lymphadenopathy:    She has cervical adenopathy.  Neurological: She is alert and oriented to person, place, and time.  Moves all extremities without deficit. Sensation grossly intact. Speaking in intermittent hoarse voice  Skin: Skin is warm and dry. No rash noted. No erythema.  Psychiatric: She has a normal mood and affect. Her behavior is normal.    ED Course  Procedures (including critical care time) Labs Review Labs Reviewed  RAPID STREP SCREEN  CULTURE, GROUP A STREP   Imaging Review No results found.  MDM    Patient states she is feeling much better after the IV steroids and Toradol. CT reveals nasopharyngeal swelling without discrete masses or abscess. Likely all viral related. Patient is advised to follow with her primary Dr. return precautions given. Patient voiced understanding.  Loren Racer, MD 11/28/12 0700

## 2012-11-28 NOTE — ED Notes (Signed)
IV team responded and will attempt access.

## 2012-11-28 NOTE — ED Notes (Signed)
CT called to inform of IV placement 

## 2012-11-28 NOTE — ED Notes (Signed)
IV team arrived. 

## 2012-11-28 NOTE — ED Notes (Signed)
Patient explains that she has had a sore throat with neck fullness for approximately 3 days. Also has pressure in ears and mild dizziness and balance issues. Has been coughing up purulent red mucous. No fevers at home or here. Denies SOB, but feels that it is difficult to take a deep breath.

## 2012-11-29 LAB — CULTURE, GROUP A STREP

## 2012-12-02 ENCOUNTER — Other Ambulatory Visit: Payer: Self-pay | Admitting: Internal Medicine

## 2012-12-04 ENCOUNTER — Encounter: Payer: Self-pay | Admitting: Internal Medicine

## 2012-12-04 ENCOUNTER — Other Ambulatory Visit: Payer: Self-pay | Admitting: *Deleted

## 2012-12-05 ENCOUNTER — Emergency Department (HOSPITAL_COMMUNITY)
Admission: EM | Admit: 2012-12-05 | Discharge: 2012-12-05 | Disposition: A | Discharge status: Home / Self Care | Payer: No Typology Code available for payment source | Attending: Emergency Medicine | Admitting: Emergency Medicine

## 2012-12-05 ENCOUNTER — Encounter (HOSPITAL_COMMUNITY): Payer: Self-pay | Admitting: Emergency Medicine

## 2012-12-05 ENCOUNTER — Emergency Department (HOSPITAL_COMMUNITY): Payer: No Typology Code available for payment source

## 2012-12-05 ENCOUNTER — Telehealth: Payer: Self-pay | Admitting: *Deleted

## 2012-12-05 DIAGNOSIS — M129 Arthropathy, unspecified: Secondary | ICD-10-CM | POA: Insufficient documentation

## 2012-12-05 DIAGNOSIS — IMO0002 Reserved for concepts with insufficient information to code with codable children: Secondary | ICD-10-CM | POA: Insufficient documentation

## 2012-12-05 DIAGNOSIS — Z79899 Other long term (current) drug therapy: Secondary | ICD-10-CM | POA: Insufficient documentation

## 2012-12-05 DIAGNOSIS — Z87891 Personal history of nicotine dependence: Secondary | ICD-10-CM | POA: Insufficient documentation

## 2012-12-05 DIAGNOSIS — M1710 Unilateral primary osteoarthritis, unspecified knee: Secondary | ICD-10-CM | POA: Insufficient documentation

## 2012-12-05 DIAGNOSIS — Z8659 Personal history of other mental and behavioral disorders: Secondary | ICD-10-CM | POA: Insufficient documentation

## 2012-12-05 DIAGNOSIS — J029 Acute pharyngitis, unspecified: Secondary | ICD-10-CM | POA: Insufficient documentation

## 2012-12-05 MED ORDER — KETOROLAC TROMETHAMINE 60 MG/2ML IM SOLN
60.0000 mg | Freq: Once | INTRAMUSCULAR | Status: AC
  Administered 2012-12-05: 60 mg via INTRAMUSCULAR
  Filled 2012-12-05: qty 2

## 2012-12-05 MED ORDER — DEXAMETHASONE SODIUM PHOSPHATE 10 MG/ML IJ SOLN
10.0000 mg | Freq: Once | INTRAMUSCULAR | Status: AC
  Administered 2012-12-05: 10 mg via INTRAMUSCULAR
  Filled 2012-12-05: qty 1

## 2012-12-05 NOTE — ED Provider Notes (Signed)
CSN: [id number]     Arrival date & time 12/05/12  1740 History  This chart was scribed for Felicie Morn, NP working with Enid Skeens, MD by Carl Best, ED Scribe. This patient was seen in room TR04C/TR04C and the patient's care was started at 6:55 PM.    Chief Complaint  Patient presents with  . Sore Throat    Patient is a 42 y.o. female presenting with pharyngitis. The history is provided by the patient. No language interpreter was used.  Sore Throat   HPI Comments: Susan Wang is a 42 y.o. female who presents to the Emergency Department complaining of a constant sore throat radiating to her chest that started two weeks ago.  She has been previously evaluated in the ED  She denies fever as an associated symptom.  She confirms trouble swallowing as an associated symptom.  She states that talking aggravates the pain.    Past Medical History  Diagnosis Date  . Arthritis   . DJD (degenerative joint disease)   . Osteoarthritis     knees  . Bronchitis   . Plantar fasciitis   . Chest pain 09/2012  . Anxiety   . Shortness of breath   . Carpal tunnel syndrome   . DDD (degenerative disc disease)   . Carpal tunnel syndrome    Past Surgical History  Procedure Laterality Date  . Cholecystectomy     Family History  Problem Relation Age of Onset  . Diabetes Father   . Other Mother   . Healthy Sister   . Healthy Brother    History  Substance Use Topics  . Smoking status: Former Smoker -- 1.00 packs/day for 13 years    Types: Cigars    Quit date: 05/14/2010  . Smokeless tobacco: Never Used  . Alcohol Use: No     Comment: occasionally   OB History   Grav Para Term Preterm Abortions TAB SAB Ect Mult Living                 Review of Systems  Constitutional: Negative for fever.  HENT: Positive for sore throat and trouble swallowing.   All other systems reviewed and are negative.    Allergies  Other  Home Medications   Current Outpatient Rx  Name  Route  Sig   Dispense  Refill  . albuterol (PROVENTIL HFA;VENTOLIN HFA) 108 (90 BASE) MCG/ACT inhaler   Inhalation   Inhale 2 puffs into the lungs every 6 (six) hours as needed for wheezing.   1 Inhaler   0   . aspirin-sod bicarb-citric acid (ALKA-SELTZER) 325 MG TBEF tablet   Oral   Take 325 mg by mouth every 6 (six) hours as needed.         . benzonatate (TESSALON) 100 MG capsule   Oral   Take 1 capsule (100 mg total) by mouth every 8 (eight) hours.   21 capsule   0   . mometasone (NASONEX) 50 MCG/ACT nasal spray   Nasal   Place 2 sprays into the nose daily.   17 g   12   . OVER THE COUNTER MEDICATION   Oral   Take 1 capsule by mouth every 6 (six) hours as needed. Store brand sinus medication         . oxymetazoline (AFRIN NASAL SPRAY) 0.05 % nasal spray   Nasal   Place 2 sprays into the nose 2 (two) times daily.   30 mL   0   .  HYDROcodone-acetaminophen (NORCO/VICODIN) 5-325 MG per tablet   Oral   Take 1 tablet by mouth every 6 (six) hours as needed for pain.   15 tablet   0    Triage Vitals: BP 142/85  Pulse 68  Temp(Src) 99.8 F (37.7 C) (Oral)  Resp 16  SpO2 97%  LMP 11/13/2012  Physical Exam  Nursing note and vitals reviewed. Constitutional: She is oriented to person, place, and time. She appears well-developed and well-nourished. No distress.  HENT:  Head: Normocephalic and atraumatic.  Difficulty swallowing secretions.   Eyes: EOM are normal.  Neck: Neck supple. No tracheal deviation present.  Cardiovascular: Normal rate.   Pulmonary/Chest: Effort normal. No respiratory distress.  Musculoskeletal: Normal range of motion.  Neurological: She is alert and oriented to person, place, and time.  Skin: Skin is warm and dry.  Psychiatric: She has a normal mood and affect. Her behavior is normal.    ED Course  Procedures (including critical care time)  DIAGNOSTIC STUDIES: Oxygen Saturation is 97% on room air, adequate by my interpretation.    COORDINATION  OF CARE: 6:58 PM- Discussed obtaining a CT scan of the patient's throat and the patient agreed to the treatment plan.     Labs Review Labs Reviewed - No data to display Imaging Review Dg Neck Soft Tissue  12/05/2012   CLINICAL DATA:  Sore throat for 2 weeks with cough and congestion  EXAM: NECK SOFT TISSUES - 1+ VIEW  COMPARISON:  None.  FINDINGS: There is no evidence of retropharyngeal soft tissue swelling or epiglottic enlargement. The cervical airway is unremarkable and no radio-opaque foreign body identified. Moderate cervical spondylosis is noted.  IMPRESSION: Negative.   Electronically Signed   By: Davonna Belling M.D.   On: 12/05/2012 20:15    EKG Interpretation   None     Prior records and imaging reviewed.  Patient discussed with and seen by Dr. Jodi Mourning.  Patient feeling better after toradol and decadron.  Is able to handle secretions without difficulty at present. Tolerated oral fluid trial.  Patient is scheduled to see ENT tomorrow.  MDM  Pharyngitis. I personally performed the services described in this documentation, which was scribed in my presence. The recorded information has been reviewed and is accurate.    Jimmye Norman, NP 12/06/12 0029  Jimmye Norman, NP 12/06/12 [phone]  Medical screening examination/treatment/procedure(s) were conducted as a shared visit with non-physician practitioner(s) or resident  and myself.  I personally evaluated the patient during the encounter and agree with the findings and plan unless otherwise indicated.    Worsening sore throat.  Initially pt having difficulty handling secretions.  Pt had recent CT with non specific findings.  Pt improved in ED.  No trismus, uvular deviation, unilateral posterior pharyngeal edema or submandibular swelling. Pt has fup with ENT tomorrow.  DC well appearing.  No stridor, supple neck.   Pharyngitis    Enid Skeens, MD 12/06/12 [phone]

## 2012-12-05 NOTE — ED Notes (Signed)
Pt presents to department via [facility] for evaluation of sore throat and sinus congestion. Ongoing x2 weeks. Respirations unlabored. Speaking complete sentences upon arrival to ED. She is alert and oriented x4.

## 2012-12-05 NOTE — Telephone Encounter (Signed)
Pt is called and informed that her pain med refill was refused, she stated no problem she would just go to ED for her throat and they would give her a refill. It was then suggested if she had a problem with her throat she should be seen here in clinic, she agreed for an appt, it is made tomorrow pm at 1545 dr Everardo Beals,  will speak to charsetta about doing some switching of the schedules, she has done that now so pt can see her pcp

## 2012-12-05 NOTE — ED Notes (Signed)
Pt states she is unable to swallow her saliva since 1500 today. States she last ate at 12 noon, then last tried to swallow tea at 1500. No respiratory distress.

## 2012-12-05 NOTE — ED Notes (Signed)
Pt discharged.Vital signs stable and GCS 15.Discharge instruction given. 

## 2012-12-06 ENCOUNTER — Encounter: Payer: Self-pay | Admitting: Internal Medicine

## 2012-12-06 ENCOUNTER — Encounter: Payer: No Typology Code available for payment source | Admitting: Internal Medicine

## 2012-12-06 ENCOUNTER — Ambulatory Visit (INDEPENDENT_AMBULATORY_CARE_PROVIDER_SITE_OTHER): Payer: No Typology Code available for payment source | Admitting: Internal Medicine

## 2012-12-06 VITALS — BP 131/81 | HR 60 | Temp 98.4°F | Ht 60.0 in | Wt 183.9 lb

## 2012-12-06 DIAGNOSIS — K219 Gastro-esophageal reflux disease without esophagitis: Secondary | ICD-10-CM

## 2012-12-06 MED ORDER — OMEPRAZOLE MAGNESIUM 20 MG PO TBEC: 20.0000 mg | DELAYED_RELEASE_TABLET | Freq: Every day | ORAL | Status: DC

## 2012-12-06 MED ORDER — CHLORPHENIRAMINE MALEATE 4 MG PO TABS: 4.0000 mg | ORAL_TABLET | Freq: Two times a day (BID) | ORAL | Status: DC | PRN

## 2012-12-06 MED ORDER — FAMOTIDINE 10 MG PO CHEW: 20.0000 mg | CHEWABLE_TABLET | Freq: Every evening | ORAL | Status: DC | PRN

## 2012-12-06 NOTE — Patient Instructions (Signed)
Please have dinner early and wait at least 2 hour before going to bed  Prop up your self in bed  Please take pepcid and omeprazole in addition to chlorapheneramine for your cough  Once you start this regime, please stop cough drops If you loose some pounds, it will help with your reflux Please come back in 2 weeks

## 2012-12-07 NOTE — Progress Notes (Signed)
Patient ID: Susan Wang, female   DOB: Dec 13, 1970, 42 y.o.   MRN: [id number]   Subjective:   HPI: Susan Wang is a 42 y.o. woman with past medical history of DJD, carpal tunnel syndrome, GERD, anxiety, presents to the clinic with cough for 2 weeks.  Cough is nonproductive, usually worse at nighttime, without wheezing, chest pain, fevers, chills, rigors, or increased fatigue. She endorses symptoms of acid reflux. She has been using cough drops especially at night. Sometimes it feels like "throat is closing" at night time.  On 12/05/2012 and she was evaluated in the emergency department where she was told that she has a pharyngitis. RADT was negative. In ED she was treated with Decadron, and Toradol with some relief, but symptoms have actually persisted. She was unable to afford recommended Afrin, Tessalon, Nasomex nasal sprays. She has a chronic history of GERD symptoms, but she has not been using recommended PPI due to cost.   She currently denies ear aches, drainage, or a recent upper respiratory tract infection.   Past Medical History  Diagnosis Date  . Arthritis   . DJD (degenerative joint disease)   . Osteoarthritis     knees  . Bronchitis   . Plantar fasciitis   . Chest pain 09/2012  . Anxiety   . Shortness of breath   . Carpal tunnel syndrome   . DDD (degenerative disc disease)   . Carpal tunnel syndrome    Current Outpatient Prescriptions  Medication Sig Dispense Refill  . albuterol (PROVENTIL HFA;VENTOLIN HFA) 108 (90 BASE) MCG/ACT inhaler Inhale 2 puffs into the lungs every 6 (six) hours as needed for wheezing.  1 Inhaler  0  . aspirin-sod bicarb-citric acid (ALKA-SELTZER) 325 MG TBEF tablet Take 325 mg by mouth every 6 (six) hours as needed.      . chlorpheniramine (CHLOR-TRIMETON) 4 MG tablet Take 1 tablet (4 mg total) by mouth 2 (two) times daily as needed for allergies.  14 tablet  0  . famotidine (PEPCID AC) 10 MG chewable tablet Chew 2 tablets (20 mg total) by  mouth at bedtime as needed for heartburn.  60 tablet  0  . omeprazole (PRILOSEC OTC) 20 MG tablet Take 1 tablet (20 mg total) by mouth daily with breakfast.  28 tablet  1   No current facility-administered medications for this visit.   Family History  Problem Relation Age of Onset  . Diabetes Father   . Other Mother   . Healthy Sister   . Healthy Brother    History   Social History  . Marital Status: Single    Spouse Name: N/A    Number of Children: N/A  . Years of Education: N/A   Social History Main Topics  . Smoking status: Former Smoker -- 1.00 packs/day for 13 years    Types: Cigars    Quit date: 05/14/2010  . Smokeless tobacco: Never Used  . Alcohol Use: No     Comment: occasionally  . Drug Use: Yes    Special: Marijuana  . Sexual Activity: None   Other Topics Concern  . None   Social History Narrative   Lives with her [place] in [place]. She has a job.    Review of Systems: Constitutional: Denies fever, chills, diaphoresis, appetite change and fatigue.  Cardiovascular: No chest pain, palpitations and leg swelling.  Gastrointestinal: No abdominal pain, nausea, vomiting, bloody stools Genitourinary: No dysuria, frequency, hematuria, or flank pain.  Musculoskeletal: No myalgias, back pain, joint swelling, arthralgias  Psych:  No depression symptoms. No SI or SA.   Objective:  Physical Exam: Filed Vitals:   12/06/12 1601  BP: 131/81  Pulse: 60  Temp: 98.4 F (36.9 C)  TempSrc: Oral  Height: 5' (1.524 m)  Weight: 183 lb 14.4 oz (83.416 kg)  SpO2: 100%   General: Well nourished. No acute distress.  HEENT: Normal oral mucosa. MMM. Oropharynx is clear without exudates, erythema, or swellings. Upper airway patent. Ear exam is normal bilaterally Lungs: CTA bilaterally. Without wheezing. Not in respiratory distress. Able to speak in full sentences. Heart: RRR; no extra sounds or murmurs  Abdomen: Non-distended, normal BS, soft, nontender; no  hepatosplenomegaly  Extremities: No pedal edema. No joint swelling or tenderness. Neurologic: Normal EOM,  Alert and oriented x3. No obvious neurologic/cranial nerve deficits.  Assessment & Plan:  I have discussed my assessment and plan  with  my attending in the clinic, Dr. Criselda Peaches as detailed under problem based charting.

## 2012-12-07 NOTE — Assessment & Plan Note (Signed)
Assessment  The most likely cause of her cough is GERD. Ddx include upper airway cough syndrome (UACS). Pt is not a smoker and she in not a known asthmatic. She is not on ACEI.  It is possible that she is reacting from inhaled environmental irritants. Other causes including nonasthmatic eosinophilic bronchitis and bronchiectasis are low on my differential. CXR early 10/2012 was normal   Plan  - will treat with aggressive acid suppression with anti-histamine and PPI. Omeprazole 40 mg in am and Pepcid 20 mg at bedtime - Chlorpheniramine 4 mg tid prn - discussed about side effects including sedation.  - encouraged her to wait at least 2 hours after dinner before going to bed and prop her head up in bed.  - consider recheck x ray if cough does not improve with above treatment. - otherwise, f/u in 2 weeks

## 2012-12-15 NOTE — Progress Notes (Signed)
Case discussed with Dr. Zada Girt soon after the resident saw the patient.  We reviewed the resident's history and exam and pertinent patient test results.  I agree with the assessment, diagnosis, and plan of care documented in the resident's note.  The above prescribed therapy is frequented by Dr. Sherene Sires for this issue.  Will do a trial and see if patient improves.

## 2012-12-20 ENCOUNTER — Encounter: Payer: No Typology Code available for payment source | Admitting: Internal Medicine

## 2012-12-20 ENCOUNTER — Encounter: Payer: Self-pay | Admitting: Internal Medicine

## 2012-12-20 DIAGNOSIS — L03039 Cellulitis of unspecified toe: Secondary | ICD-10-CM | POA: Insufficient documentation

## 2012-12-21 NOTE — Progress Notes (Signed)
A user error has taken place: encounter opened in error, closed for administrative reasons.

## 2012-12-28 ENCOUNTER — Other Ambulatory Visit: Payer: Self-pay

## 2013-01-31 ENCOUNTER — Encounter: Payer: Self-pay | Admitting: Internal Medicine

## 2013-07-05 ENCOUNTER — Emergency Department (HOSPITAL_COMMUNITY)
Admission: EM | Admit: 2013-07-05 | Discharge: 2013-07-06 | Disposition: A | Discharge status: Home / Self Care | Payer: Self-pay | Attending: Emergency Medicine | Admitting: Emergency Medicine

## 2013-07-05 ENCOUNTER — Encounter (HOSPITAL_COMMUNITY): Payer: Self-pay | Admitting: Emergency Medicine

## 2013-07-05 DIAGNOSIS — Y9301 Activity, walking, marching and hiking: Secondary | ICD-10-CM | POA: Insufficient documentation

## 2013-07-05 DIAGNOSIS — IMO0002 Reserved for concepts with insufficient information to code with codable children: Secondary | ICD-10-CM | POA: Insufficient documentation

## 2013-07-05 DIAGNOSIS — X500XXA Overexertion from strenuous movement or load, initial encounter: Secondary | ICD-10-CM | POA: Insufficient documentation

## 2013-07-05 DIAGNOSIS — Z9089 Acquired absence of other organs: Secondary | ICD-10-CM | POA: Insufficient documentation

## 2013-07-05 DIAGNOSIS — Z8659 Personal history of other mental and behavioral disorders: Secondary | ICD-10-CM | POA: Insufficient documentation

## 2013-07-05 DIAGNOSIS — Y9289 Other specified places as the place of occurrence of the external cause: Secondary | ICD-10-CM | POA: Insufficient documentation

## 2013-07-05 DIAGNOSIS — Z8669 Personal history of other diseases of the nervous system and sense organs: Secondary | ICD-10-CM | POA: Insufficient documentation

## 2013-07-05 DIAGNOSIS — M1710 Unilateral primary osteoarthritis, unspecified knee: Secondary | ICD-10-CM | POA: Insufficient documentation

## 2013-07-05 DIAGNOSIS — S76219A Strain of adductor muscle, fascia and tendon of unspecified thigh, initial encounter: Secondary | ICD-10-CM

## 2013-07-05 DIAGNOSIS — Z87891 Personal history of nicotine dependence: Secondary | ICD-10-CM | POA: Insufficient documentation

## 2013-07-05 DIAGNOSIS — Z8709 Personal history of other diseases of the respiratory system: Secondary | ICD-10-CM | POA: Insufficient documentation

## 2013-07-05 DIAGNOSIS — Y990 Civilian activity done for income or pay: Secondary | ICD-10-CM | POA: Insufficient documentation

## 2013-07-05 NOTE — ED Notes (Signed)
Pt. reports sudden left groin pain while walking at work this evening , denies injury or fall , pain worse with movement.

## 2013-07-05 NOTE — ED Notes (Signed)
Pt c/o sudden onset of left groin pain while walking at work earlier [employer].  St's her left leg feels swollen

## 2013-07-06 ENCOUNTER — Emergency Department (HOSPITAL_COMMUNITY): Payer: Self-pay

## 2013-07-06 MED ORDER — TRAMADOL HCL 50 MG PO TABS: 50.0000 mg | ORAL_TABLET | Freq: Four times a day (QID) | ORAL | Status: DC | PRN

## 2013-07-06 MED ORDER — METHOCARBAMOL 500 MG PO TABS
500.0000 mg | ORAL_TABLET | Freq: Once | ORAL | Status: AC
Start: 2013-07-06 — End: 2013-07-06
  Administered 2013-07-06: 500 mg via ORAL
  Filled 2013-07-06: qty 1

## 2013-07-06 MED ORDER — KETOROLAC TROMETHAMINE 60 MG/2ML IM SOLN
60.0000 mg | Freq: Once | INTRAMUSCULAR | Status: AC
  Administered 2013-07-06: 60 mg via INTRAMUSCULAR
  Filled 2013-07-06: qty 2

## 2013-07-06 MED ORDER — IBUPROFEN 600 MG PO TABS: 600.0000 mg | ORAL_TABLET | Freq: Four times a day (QID) | ORAL | Status: DC | PRN

## 2013-07-06 MED ORDER — METHOCARBAMOL 500 MG PO TABS: 500.0000 mg | ORAL_TABLET | Freq: Two times a day (BID) | ORAL | Status: DC | PRN

## 2013-07-06 NOTE — ED Provider Notes (Signed)
CSN: [id number]     Arrival date & time 07/05/13  2023 History   First MD Initiated Contact with Patient 07/05/13 2358     Chief Complaint  Patient presents with  . Groin Pain     (Consider location/radiation/quality/duration/timing/severity/associated sxs/prior Treatment) HPI Patient presents with left-sided groin pain while walking at work this evening. Pain is worse with palpation and movement. She denies nausea, vomiting or diarrhea. Denies any abdominal pain. She has no urinary symptoms. She has no focal weakness or numbness. Past Medical History  Diagnosis Date  . Arthritis   . DJD (degenerative joint disease)   . Osteoarthritis     knees  . Bronchitis   . Plantar fasciitis   . Chest pain 09/2012  . Anxiety   . Shortness of breath   . Carpal tunnel syndrome   . DDD (degenerative disc disease)   . Carpal tunnel syndrome    Past Surgical History  Procedure Laterality Date  . Cholecystectomy     Family History  Problem Relation Age of Onset  . Diabetes Father   . Other Mother   . Healthy Sister   . Healthy Brother    History  Substance Use Topics  . Smoking status: Former Smoker -- 1.00 packs/day for 13 years    Types: Cigars    Quit date: 05/14/2010  . Smokeless tobacco: Never Used  . Alcohol Use: No     Comment: occasionally   OB History   Grav Para Term Preterm Abortions TAB SAB Ect Mult Living                 Review of Systems  Constitutional: Negative for fever and chills.  Gastrointestinal: Negative for nausea, vomiting, abdominal pain and diarrhea.  Genitourinary: Negative for dysuria and flank pain.  Musculoskeletal: Positive for myalgias. Negative for back pain and neck stiffness.  Skin: Negative for rash and wound.  Neurological: Negative for dizziness, syncope, weakness, numbness and headaches.  All other systems reviewed and are negative.     Allergies  Other  Home Medications   Prior to Admission medications   Medication Sig Start  Date End Date Taking? Authorizing Provider  albuterol (PROVENTIL HFA;VENTOLIN HFA) 108 (90 BASE) MCG/ACT inhaler Inhale 2 puffs into the lungs every 6 (six) hours as needed for wheezing. 10/14/12   Pleas Kochhristopher Komanski, MD  aspirin-sod bicarb-citric acid (ALKA-SELTZER) 325 MG TBEF tablet Take 325 mg by mouth every 6 (six) hours as needed.    Historical Provider, MD  chlorpheniramine (CHLOR-TRIMETON) 4 MG tablet Take 1 tablet (4 mg total) by mouth 2 (two) times daily as needed for allergies. 12/06/12   Dow Adolphichard Kazibwe, MD  famotidine (PEPCID AC) 10 MG chewable tablet Chew 2 tablets (20 mg total) by mouth at bedtime as needed for heartburn. 12/06/12   Dow Adolphichard Kazibwe, MD  omeprazole (PRILOSEC OTC) 20 MG tablet Take 1 tablet (20 mg total) by mouth daily with breakfast. 12/06/12 12/06/13  Dow Adolphichard Kazibwe, MD   BP 134/81  Pulse 60  Temp(Src) 98.8 F (37.1 C) (Oral)  Resp 18  SpO2 98%  LMP 07/01/2013 Physical Exam  Nursing note and vitals reviewed. Constitutional: She is oriented to person, place, and time. She appears well-developed and well-nourished. No distress.  HENT:  Head: Normocephalic and atraumatic.  Eyes: EOM are normal. Pupils are equal, round, and reactive to light.  Neck: Normal range of motion. Neck supple.  Cardiovascular: Normal rate and regular rhythm.   Pulmonary/Chest: Effort normal and breath sounds normal.  Abdominal: Soft. Bowel sounds are normal. She exhibits no distension and no mass. There is no tenderness. There is no rebound and no guarding.  Musculoskeletal: Normal range of motion. She exhibits tenderness. She exhibits no edema.  Tenderness and left inguinal region. No masses appreciated. Pain with range of motion of the left hip. Distal pulses are intact.  Neurological: She is alert and oriented to person, place, and time.  Patient is alert and oriented x3 with clear, goal oriented speech. Patient has 5/5 motor in all extremities the left lower extremity exam limited  by pain. Sensation is intact to light touch. Patient is ambulatory in the emergency department  Skin: Skin is warm and dry. No rash noted. No erythema.  Psychiatric: She has a normal mood and affect. Her behavior is normal.    ED Course  Procedures (including critical care time) Labs Review Labs Reviewed - No data to display  Imaging Review No results found.   EKG Interpretation None      MDM   Final diagnoses:  None    Suspect groin strain. We'll get x-ray to rule out bony abnormality. Abdomen is soft and nontender. Very low suspicion for intra-abdominal process.  Normal appearing x-ray. Return precautions given  Loren Raceravid Briana Farner, MD 07/06/13 [phone]

## 2013-07-06 NOTE — Discharge Instructions (Signed)
Groin Strain  A groin strain (also called a groin pull) is an injury to the muscles or tendon on the upper inner part of the thigh. These muscles are called the adductor muscles or groin muscles. They are responsible for moving the leg across the body. A muscle strain occurs when a muscle is overstretched and some muscle fibers are torn. A groin strain can range from mild to severe depending on how many muscle fibers are affected and whether the muscle fibers are partially or completely torn.   Groin strains usually occur during exercise or participation in sports. The injury often happens when a sudden, violent force is placed on a muscle, stretching the muscle too far. A strain is more likely to occur when your muscles are not warmed up or if you are not properly conditioned. Depending on the severity of the groin strain, recovery time may vary from a few weeks to several weeks. Severe injuries often require 4 6 weeks for recovery. In these cases, complete healing can take 4 5 months.   CAUSES    Stretching the groin muscles too far or too suddenly, often during side-to-side motion with an abrupt change in direction.   Putting repeated stress on the groin muscles over a long period of time.   Performing vigorous activity without properly stretching the groin muscles beforehand.  SYMPTOMS    Pain and tenderness in the groin area. This begins as sharp pain and persists as a dull ache.   Popping or snapping feeling when the injury occurs (for severe strains).   Swelling or bruising.   Muscle spasms.   Weakness in the leg.   Stiffness in the groin area with decreased ability to move the affected muscles.  DIAGNOSIS   Your caregiver will perform a physical exam to diagnose a groin strain. You will be asked about your symptoms and how the injury occurred. X-rays are sometimes needed to rule out a broken bone or cartilage problems. Your caregiver may order a CT scan or MRI if a complete muscle tear is  suspected.  TREATMENT   A groin strain will often heal on its own. Your caregiver may prescribe medicines to help manage pain and swelling (anti-inflammatory medicine). You may be told to use crutches for the first few days to minimize your pain.  HOME CARE INSTRUCTIONS    Rest. Do not use the strained muscle if it causes pain.   Put ice on the injured area.   Put ice in a plastic bag.   Place a towel between your skin and the bag.   Leave the ice on for 15 20 minutes, every 2 3 hours. Do this for the first 2 days after the injury.   Only take over-the-counter or prescription medicines as directed by your caregiver.   Wrap the injured area with an elastic bandage as directed by your caregiver.   Keep the injured leg raised (elevated).   Walk, stretch, and perform range-of-motion exercises to improve blood flow to the injured area. Only perform these activities if you can do so without any pain.  To prevent muscle strains:   Warm up before exercise.   Develop proper conditioning and strength in the groin muscles.  SEEK IMMEDIATE MEDICAL CARE IF:    You have increased pain or swelling in the affected area.    Your symptoms are not improving or are getting worse.  MAKE SURE YOU:    Understand these instructions.   Will watch your condition.     Will get help right away if you are not doing well or get worse.  Document Released: 10/07/2003 Document Revised: 01/26/2012 Document Reviewed: 10/13/2011  [employer] Patient Information 2014 [employer]

## 2013-07-06 NOTE — ED Notes (Signed)
Pt to xray at this time.

## 2013-11-05 ENCOUNTER — Emergency Department (HOSPITAL_COMMUNITY)
Admission: EM | Admit: 2013-11-05 | Discharge: 2013-11-05 | Disposition: A | Discharge status: Home / Self Care | Payer: Self-pay | Attending: Emergency Medicine | Admitting: Emergency Medicine

## 2013-11-05 ENCOUNTER — Encounter (HOSPITAL_COMMUNITY): Payer: Self-pay | Admitting: Emergency Medicine

## 2013-11-05 ENCOUNTER — Emergency Department (HOSPITAL_COMMUNITY): Payer: Self-pay

## 2013-11-05 DIAGNOSIS — J069 Acute upper respiratory infection, unspecified: Secondary | ICD-10-CM | POA: Insufficient documentation

## 2013-11-05 DIAGNOSIS — IMO0002 Reserved for concepts with insufficient information to code with codable children: Secondary | ICD-10-CM | POA: Insufficient documentation

## 2013-11-05 DIAGNOSIS — J209 Acute bronchitis, unspecified: Secondary | ICD-10-CM | POA: Insufficient documentation

## 2013-11-05 DIAGNOSIS — F17200 Nicotine dependence, unspecified, uncomplicated: Secondary | ICD-10-CM | POA: Insufficient documentation

## 2013-11-05 DIAGNOSIS — R0602 Shortness of breath: Secondary | ICD-10-CM | POA: Insufficient documentation

## 2013-11-05 DIAGNOSIS — Z79899 Other long term (current) drug therapy: Secondary | ICD-10-CM | POA: Insufficient documentation

## 2013-11-05 DIAGNOSIS — Z8669 Personal history of other diseases of the nervous system and sense organs: Secondary | ICD-10-CM | POA: Insufficient documentation

## 2013-11-05 DIAGNOSIS — Z8659 Personal history of other mental and behavioral disorders: Secondary | ICD-10-CM | POA: Insufficient documentation

## 2013-11-05 DIAGNOSIS — M1710 Unilateral primary osteoarthritis, unspecified knee: Secondary | ICD-10-CM | POA: Insufficient documentation

## 2013-11-05 DIAGNOSIS — F17290 Nicotine dependence, other tobacco product, uncomplicated: Secondary | ICD-10-CM

## 2013-11-05 LAB — BASIC METABOLIC PANEL
Anion gap: 15 (ref 5–15)
BUN: 12 mg/dL (ref 6–23)
CALCIUM: 9.5 mg/dL (ref 8.4–10.5)
CO2: 24 meq/L (ref 19–32)
Chloride: 100 mEq/L (ref 96–112)
Creatinine, Ser: 0.7 mg/dL (ref 0.50–1.10)
GFR calc Af Amer: >90 mL/min (ref >90)
GFR calc non Af Amer: >90 mL/min (ref >90)
GLUCOSE: 88 mg/dL (ref 70–99)
Potassium: 4.2 mEq/L (ref 3.7–5.3)
Sodium: 139 mEq/L (ref 137–147)

## 2013-11-05 LAB — I-STAT TROPONIN, ED: Troponin i, poc: 0 ng/mL (ref 0.00–0.08)

## 2013-11-05 LAB — CBC
HEMATOCRIT: 45.1 % (ref 36.0–46.0)
HEMOGLOBIN: 15.2 g/dL — AB (ref 12.0–15.0)
MCH: 31.4 pg (ref 26.0–34.0)
MCHC: 33.7 g/dL (ref 30.0–36.0)
MCV: 93.2 fL (ref 78.0–100.0)
Platelets: 198 10*3/uL (ref 150–400)
RBC: 4.84 MIL/uL (ref 3.87–5.11)
RDW: 12.9 % (ref 11.5–15.5)
WBC: 8.8 10*3/uL (ref 4.0–10.5)

## 2013-11-05 MED ORDER — IPRATROPIUM-ALBUTEROL 0.5-2.5 (3) MG/3ML IN SOLN
3.0000 mL | Freq: Once | RESPIRATORY_TRACT | Status: AC
  Administered 2013-11-05: 3 mL via RESPIRATORY_TRACT
  Filled 2013-11-05: qty 3

## 2013-11-05 MED ORDER — HYDROCODONE-ACETAMINOPHEN 7.5-325 MG/15ML PO SOLN: 15.0000 mL | Freq: Three times a day (TID) | ORAL | Status: DC | PRN

## 2013-11-05 MED ORDER — PREDNISONE 20 MG PO TABS
60.0000 mg | ORAL_TABLET | Freq: Once | ORAL | Status: AC
  Administered 2013-11-05: 60 mg via ORAL
  Filled 2013-11-05: qty 3

## 2013-11-05 MED ORDER — CEFTRIAXONE SODIUM 1 G IJ SOLR
1.0000 g | Freq: Once | INTRAMUSCULAR | Status: AC
  Administered 2013-11-05: 1 g via INTRAMUSCULAR
  Filled 2013-11-05: qty 10

## 2013-11-05 MED ORDER — IPRATROPIUM BROMIDE 0.02 % IN SOLN
0.5000 mg | Freq: Once | RESPIRATORY_TRACT | Status: AC
  Administered 2013-11-05: 0.5 mg via RESPIRATORY_TRACT

## 2013-11-05 MED ORDER — ALBUTEROL SULFATE HFA 108 (90 BASE) MCG/ACT IN AERS
2.0000 | INHALATION_SPRAY | Freq: Once | RESPIRATORY_TRACT | Status: AC
  Administered 2013-11-05: 2 via RESPIRATORY_TRACT
  Filled 2013-11-05: qty 6.7

## 2013-11-05 MED ORDER — PREDNISONE 50 MG PO TABS: ORAL_TABLET | ORAL | Status: DC

## 2013-11-05 MED ORDER — DEXTROSE 5 % IV SOLN
500.0000 mg | Freq: Once | INTRAVENOUS | Status: AC
  Administered 2013-11-05: 500 mg via INTRAVENOUS
  Filled 2013-11-05: qty 500

## 2013-11-05 MED ORDER — AZITHROMYCIN 250 MG PO TABS: ORAL_TABLET | ORAL | Status: DC

## 2013-11-05 MED ORDER — ALBUTEROL SULFATE (2.5 MG/3ML) 0.083% IN NEBU
INHALATION_SOLUTION | RESPIRATORY_TRACT | Status: AC
  Administered 2013-11-05: 5 mg
  Filled 2013-11-05: qty 6

## 2013-11-05 MED ORDER — HYDROCOD POLST-CHLORPHEN POLST 10-8 MG/5ML PO LQCR
5.0000 mL | Freq: Once | ORAL | Status: AC
  Administered 2013-11-05: 5 mL via ORAL
  Filled 2013-11-05: qty 5

## 2013-11-05 MED ORDER — IPRATROPIUM BROMIDE 0.02 % IN SOLN
RESPIRATORY_TRACT | Status: AC
  Filled 2013-11-05: qty 2.5

## 2013-11-05 NOTE — Discharge Instructions (Signed)
Please follow with your primary care doctor in the next 2 days for a check-up. They must obtain records for further management.   Do not hesitate to return to the Emergency Department for any new, worsening or concerning symptoms.   Take Hycet  for cough and pain. Do not drink alcohol, drive, care for children or do other critical tasks while taking Hycet.   Take your antibiotics as directed and to completion. You should never have any leftover antibiotics! Push fluids and stay well hydrated.     Acute Bronchitis Bronchitis is inflammation of the airways that extend from the windpipe into the lungs (bronchi). The inflammation often causes mucus to develop. This leads to a cough, which is the most common symptom of bronchitis.  In acute bronchitis, the condition usually develops suddenly and goes away over time, usually in a couple weeks. Smoking, allergies, and asthma can make bronchitis worse. Repeated episodes of bronchitis may cause further lung problems.  CAUSES Acute bronchitis is most often caused by the same virus that causes a cold. The virus can spread from person to person (contagious) through coughing, sneezing, and touching contaminated objects. SIGNS AND SYMPTOMS   Cough.   Fever.   Coughing up mucus.   Body aches.   Chest congestion.   Chills.   Shortness of breath.   Sore throat.  DIAGNOSIS  Acute bronchitis is usually diagnosed through a physical exam. Your health care provider will also ask you questions about your medical history. Tests, such as chest X-rays, are sometimes done to rule out other conditions.  TREATMENT  Acute bronchitis usually goes away in a couple weeks. Oftentimes, no medical treatment is necessary. Medicines are sometimes given for relief of fever or cough. Antibiotic medicines are usually not needed but may be prescribed in certain situations. In some cases, an inhaler may be recommended to help reduce shortness of breath and control  the cough. A cool mist vaporizer may also be used to help thin bronchial secretions and make it easier to clear the chest.  HOME CARE INSTRUCTIONS  Get plenty of rest.   Drink enough fluids to keep your urine clear or pale yellow (unless you have a medical condition that requires fluid restriction). Increasing fluids may help thin your respiratory secretions (sputum) and reduce chest congestion, and it will prevent dehydration.   Take medicines only as directed by your health care provider.  If you were prescribed an antibiotic medicine, finish it all even if you start to feel better.  Avoid smoking and secondhand smoke. Exposure to cigarette smoke or irritating chemicals will make bronchitis worse. If you are a smoker, consider using nicotine gum or skin patches to help control withdrawal symptoms. Quitting smoking will help your lungs heal faster.   Reduce the chances of another bout of acute bronchitis by washing your hands frequently, avoiding people with cold symptoms, and trying not to touch your hands to your mouth, nose, or eyes.   Keep all follow-up visits as directed by your health care provider.  SEEK MEDICAL CARE IF: Your symptoms do not improve after 1 week of treatment.  SEEK IMMEDIATE MEDICAL CARE IF:  You develop an increased fever or chills.   You have chest pain.   You have severe shortness of breath.  You have bloody sputum.   You develop dehydration.  You faint or repeatedly feel like you are going to pass out.  You develop repeated vomiting.  You develop a severe headache. MAKE SURE YOU:  Understand these instructions.  Will watch your condition.  Will get help right away if you are not doing well or get worse. Document Released: 03/18/2004 Document Revised: 06/25/2013 Document Reviewed: 08/01/2012 [facility] Patient Information 2015 [place], [place]. This information is not intended to replace advice given to you by your health care provider.  Make sure you discuss any questions you have with your health care provider.

## 2013-11-05 NOTE — ED Notes (Signed)
Wheezing started this am w change of wweather pt arrives unable to make full sentence

## 2013-11-05 NOTE — ED Provider Notes (Signed)
MSE was initiated and I personally evaluated the patient and placed orders (if any) at  1:51 PM on November 05, 2013.  Patient presenting with shortness of breath, both her breasts and on exertion with associated chest pain. States she is coughing up a lot of mucus. Admits to bilateral lower extremity edema, worsening over the past few days. On exam, she is slightly tachycardic, labored breathing but in no respiratory distress. O2 sat 98% on room air. Lungs with scattered inspiratory and expiratory wheezes and rhonchi. Labs, chest x-ray pending. I do not feel patient is appropriate for fast track. She will be moved to the main side of the emergency department.  The patient appears stable so that the remainder of the MSE may be completed by another provider.  Trevor Mace, PA-C 11/05/13 1353

## 2013-11-05 NOTE — Discharge Planning (Signed)
[facility] Liaison  Patient is a current orange card holder and established as a patient at [facility]. Patient states she has not been seen by her pcp in some time, and expressed no concerns with her orange card. Follow up appointment made by this liaison for Thursday Nov 22, 2013 at 9:15, pt verbalized understanding of the appointment. New orange card application given to renew card, as well as my contact information for any future questions or concerns. No other Community Liaison needs identified at this time.

## 2013-11-05 NOTE — ED Provider Notes (Signed)
Medical screening examination/treatment/procedure(s) were performed by non-physician practitioner and as supervising physician I was immediately available for consultation/collaboration.  Pepe Mineau, MD 11/05/13 1612 

## 2013-11-05 NOTE — ED Provider Notes (Signed)
CSN: [id number]     Arrival date & time 11/05/13  1315 History   First MD Initiated Contact with Patient 11/05/13 1337     Chief Complaint  Patient presents with  . Chest Pain  . Shortness of Breath     (Consider location/radiation/quality/duration/timing/severity/associated sxs/prior Treatment) HPI  Susan Wang is a 43 y.o. female complaining of wheezing, chest pain and shortness of breath. Pain is across the anterior chest and is exacerbated by coughing. Patient states she has been coughing for 2 days. She denies fever, chills, abdominal pain, nausea vomiting, change in bowel or bladder habits. Patient states that she is an active daily smoker. She uses marijuana as well. Today she has a history of frequent bronchitis. On review of systems patient endorses rhinorrhea  Past Medical History  Diagnosis Date  . Arthritis   . DJD (degenerative joint disease)   . Osteoarthritis     knees  . Bronchitis   . Plantar fasciitis   . Chest pain 09/2012  . Anxiety   . Shortness of breath   . Carpal tunnel syndrome   . DDD (degenerative disc disease)   . Carpal tunnel syndrome    Past Surgical History  Procedure Laterality Date  . Cholecystectomy     Family History  Problem Relation Age of Onset  . Diabetes Father   . Other Mother   . Healthy Sister   . Healthy Brother    History  Substance Use Topics  . Smoking status: Former Smoker -- 1.00 packs/day for 13 years    Types: Cigars    Quit date: 05/14/2010  . Smokeless tobacco: Never Used  . Alcohol Use: No     Comment: occasionally   OB History   Grav Para Term Preterm Abortions TAB SAB Ect Mult Living                 Review of Systems  10 systems reviewed and found to be negative, except as noted in the HPI.   Allergies  Other  Home Medications   Prior to Admission medications   Medication Sig Start Date End Date Taking? Authorizing Provider  albuterol (PROVENTIL HFA;VENTOLIN HFA) 108 (90 BASE) MCG/ACT inhaler  Inhale 2 puffs into the lungs every 6 (six) hours as needed for wheezing. 10/14/12  Yes Pleas Koch, MD  Chlorphen-Phenyleph-ASA (ALKA-SELTZER PLUS COLD PO) Take 2 tablets by mouth daily as needed (cold).   Yes Historical Provider, MD  azithromycin (ZITHROMAX Z-PAK) 250 MG tablet 2 po day one, then 1 daily x 4 days 11/05/13   Joni Reining Joe Gee, PA-C  HYDROcodone-acetaminophen (HYCET) 7.5-325 mg/15 ml solution Take 15 mLs by mouth every 8 (eight) hours as needed for moderate pain. 11/05/13   Hipolito Martinezlopez, PA-C  predniSONE (DELTASONE) 50 MG tablet Take 1 tablet daily with breakfast 11/05/13   Doristine Shehan, PA-C   BP 126/72  Pulse 61  Temp(Src) 98.2 F (36.8 C) (Oral)  Resp 28  SpO2 100% Physical Exam  Nursing note and vitals reviewed. Constitutional: She is oriented to person, place, and time. She appears well-developed and well-nourished. No distress.  HENT:  Head: Normocephalic and atraumatic.  Right Ear: External ear normal.  Mouth/Throat: Oropharynx is clear and moist.  No stridor or drooling. No posterior pharynx edema, lip or tongue swelling. Pt reclining comfortably, speaking in complete sentences.       Eyes: Conjunctivae and EOM are normal. Pupils are equal, round, and reactive to light.  Neck: Normal range of motion.  Cardiovascular: Normal  rate.   Pulmonary/Chest: Effort normal and breath sounds normal. No stridor. No respiratory distress. She has no wheezes. She has no rales. She exhibits tenderness.    Upper airway sounds conducted into the lungs. Lung sounds are clear, excellent air movement in all fields, no wheezing  Abdominal: There is no tenderness.  Musculoskeletal: Normal range of motion.  Neurological: She is alert and oriented to person, place, and time.  Psychiatric: She has a normal mood and affect.    ED Course  Procedures (including critical care time) Labs Review Labs Reviewed  CBC - Abnormal; Notable for the following:    Hemoglobin 15.2  (*)    All other components within normal limits  BASIC METABOLIC PANEL  PRO B NATRIURETIC PEPTIDE  I-STAT TROPOININ, ED    Imaging Review Dg Chest 2 View  11/05/2013   CLINICAL DATA:  Wheezing and chest pain.  EXAM: CHEST  2 VIEW  COMPARISON:  10/13/2012  FINDINGS: The heart size and mediastinal contours are within normal limits. Both lungs are clear. Mild degenerative change of the spine with subtle curvature of the thoracic spine convex to the left.  IMPRESSION: No active cardiopulmonary disease.   Electronically Signed   By: Elberta Fortis M.D.   On: 11/05/2013 14:34     EKG Interpretation None      MDM   Final diagnoses:  URI (upper respiratory infection)  Acute bronchitis, unspecified organism  Cigar smoker    Filed Vitals:   11/05/13 1629 11/05/13 1715 11/05/13 1730 11/05/13 1745  BP: 133/81 125/110 134/63 126/72  Pulse: 61 70 78 61  Temp: 98.2 F (36.8 C)     TempSrc: Oral     Resp:   27 28  SpO2: 100% 100% 100% 100%    Medications  ipratropium-albuterol (DUONEB) 0.5-2.5 (3) MG/3ML nebulizer solution 3 mL (not administered)  chlorpheniramine-HYDROcodone (TUSSIONEX) 10-8 MG/5ML suspension 5 mL (not administered)  azithromycin (ZITHROMAX) 500 mg in dextrose 5 % 250 mL IVPB (not administered)  cefTRIAXone (ROCEPHIN) injection 1 g (not administered)  albuterol (PROVENTIL HFA;VENTOLIN HFA) 108 (90 BASE) MCG/ACT inhaler 2 puff (not administered)  albuterol (PROVENTIL) (2.5 MG/3ML) 0.083% nebulizer solution (5 mg  Given 11/05/13 1334)  ipratropium (ATROVENT) nebulizer solution 0.5 mg (0.5 mg Nebulization Given 11/05/13 1336)  predniSONE (DELTASONE) tablet 60 mg (60 mg Oral Given 11/05/13 1347)    Susan Wang is a 43 y.o. female presenting with chest pain, shortness of breath and wheezing. Patient was given prednisone and DuoNeb with excellent improvement in her sensation of shortness of breath and in her lung sounds. Patient will be started on antibiotics out of an  abundance of caution because she is an active daily smoker. Chest x-ray does not show an infiltrate. She will be given another nebulizer treatments and cough medication in the ED. Vital signs remained stable.  Reports significant subjective improvement. We'll give her an albuterol inhaler to take home with her. We have discussed return precautions  Evaluation does not show pathology that would require ongoing emergent intervention or inpatient treatment. Pt is hemodynamically stable and mentating appropriately. Discussed findings and plan with patient/guardian, who agrees with care plan. All questions answered. Return precautions discussed and outpatient follow up given.   New Prescriptions   AZITHROMYCIN (ZITHROMAX Z-PAK) 250 MG TABLET    2 po day one, then 1 daily x 4 days   HYDROCODONE-ACETAMINOPHEN (HYCET) 7.5-325 MG/15 ML SOLUTION    Take 15 mLs by mouth every 8 (eight) hours as needed for  moderate pain.   PREDNISONE (DELTASONE) 50 MG TABLET    Take 1 tablet daily with breakfast         Wynetta Emery, PA-C 11/05/13 2007

## 2013-11-05 NOTE — ED Notes (Signed)
Placed on 2L for work of breathing 

## 2013-11-06 NOTE — ED Provider Notes (Signed)
Medical screening examination/treatment/procedure(s) were performed by non-physician practitioner and as supervising physician I was immediately available for consultation/collaboration.  Shakema Surita L Nelli Swalley, MD 11/06/13 0014 

## 2013-11-22 ENCOUNTER — Ambulatory Visit: Payer: Self-pay | Admitting: Internal Medicine

## 2013-11-22 ENCOUNTER — Encounter: Payer: Self-pay | Admitting: Internal Medicine

## 2014-01-31 ENCOUNTER — Emergency Department (HOSPITAL_COMMUNITY): Payer: Self-pay

## 2014-01-31 ENCOUNTER — Encounter (HOSPITAL_COMMUNITY): Payer: Self-pay | Admitting: Emergency Medicine

## 2014-01-31 ENCOUNTER — Emergency Department (HOSPITAL_COMMUNITY)
Admission: EM | Admit: 2014-01-31 | Discharge: 2014-01-31 | Disposition: A | Discharge status: Home / Self Care | Payer: Self-pay | Attending: Emergency Medicine | Admitting: Emergency Medicine

## 2014-01-31 DIAGNOSIS — Z8669 Personal history of other diseases of the nervous system and sense organs: Secondary | ICD-10-CM | POA: Insufficient documentation

## 2014-01-31 DIAGNOSIS — Z8659 Personal history of other mental and behavioral disorders: Secondary | ICD-10-CM | POA: Insufficient documentation

## 2014-01-31 DIAGNOSIS — Z87891 Personal history of nicotine dependence: Secondary | ICD-10-CM | POA: Insufficient documentation

## 2014-01-31 DIAGNOSIS — Z79899 Other long term (current) drug therapy: Secondary | ICD-10-CM | POA: Insufficient documentation

## 2014-01-31 DIAGNOSIS — M1711 Unilateral primary osteoarthritis, right knee: Secondary | ICD-10-CM | POA: Insufficient documentation

## 2014-01-31 DIAGNOSIS — M25569 Pain in unspecified knee: Secondary | ICD-10-CM

## 2014-01-31 DIAGNOSIS — M7651 Patellar tendinitis, right knee: Secondary | ICD-10-CM | POA: Insufficient documentation

## 2014-01-31 DIAGNOSIS — Z8709 Personal history of other diseases of the respiratory system: Secondary | ICD-10-CM | POA: Insufficient documentation

## 2014-01-31 MED ORDER — OXYCODONE-ACETAMINOPHEN 5-325 MG PO TABS
2.0000 | ORAL_TABLET | Freq: Once | ORAL | Status: AC
  Administered 2014-01-31: 2 via ORAL
  Filled 2014-01-31: qty 2

## 2014-01-31 MED ORDER — HYDROCODONE-ACETAMINOPHEN 5-325 MG PO TABS
1.0000 | ORAL_TABLET | ORAL | Status: DC | PRN
Start: 2014-01-31 — End: 2015-08-07

## 2014-01-31 MED ORDER — IBUPROFEN 800 MG PO TABS
800.0000 mg | ORAL_TABLET | Freq: Once | ORAL | Status: AC
  Administered 2014-01-31: 800 mg via ORAL
  Filled 2014-01-31: qty 1

## 2014-01-31 MED ORDER — IBUPROFEN 800 MG PO TABS
800.0000 mg | ORAL_TABLET | Freq: Three times a day (TID) | ORAL | Status: DC | PRN
Start: 2014-01-31 — End: 2014-06-10

## 2014-01-31 NOTE — ED Notes (Signed)
Pt. reports right leg pain with swelling onset yesterday , denies injury / ambulatory.

## 2014-01-31 NOTE — ED Provider Notes (Signed)
TIME SEEN: 2:30 AM  CHIEF COMPLAINT: Right knee pain  HPI: Patient is a 43 y.o. F degenerative disc disease, sciatica, osteoarthritis who presents the emergency department with complaints of right knee pain that started yesterday. No known injury. Pain is worse with flexion of the knee and walking. Has had some swelling but no fevers, warmth or erythema. States she does have intermittent tingling in this leg but has had this with her sciatica. No new back pain. No bowel or bladder incontinence. No urinary retention.  ROS: See HPI Constitutional: no fever  Eyes: no drainage  ENT: no runny nose   Cardiovascular:  no chest pain  Resp: no SOB  GI: no vomiting GU: no dysuria Integumentary: no rash  Allergy: no hives  Musculoskeletal: no leg swelling  Neurological: no slurred speech ROS otherwise negative  PAST MEDICAL HISTORY/PAST SURGICAL HISTORY:  Past Medical History  Diagnosis Date  . Arthritis   . DJD (degenerative joint disease)   . Osteoarthritis     knees  . Bronchitis   . Plantar fasciitis   . Chest pain 09/2012  . Anxiety   . Shortness of breath   . Carpal tunnel syndrome   . DDD (degenerative disc disease)   . Carpal tunnel syndrome     MEDICATIONS:  Prior to Admission medications   Medication Sig Start Date End Date Taking? Authorizing Provider  albuterol (PROVENTIL HFA;VENTOLIN HFA) 108 (90 BASE) MCG/ACT inhaler Inhale 2 puffs into the lungs every 6 (six) hours as needed for wheezing. Patient not taking: Reported on 01/31/2014 10/14/12   Bobbye Charlestonhristopher B Komanski, MD  azithromycin (ZITHROMAX Z-PAK) 250 MG tablet 2 po day one, then 1 daily x 4 days Patient not taking: Reported on 01/31/2014 11/05/13   Joni ReiningNicole Pisciotta, PA-C  Chlorphen-Phenyleph-ASA (ALKA-SELTZER PLUS COLD PO) Take 2 tablets by mouth daily as needed (cold).    Historical Provider, MD  HYDROcodone-acetaminophen (HYCET) 7.5-325 mg/15 ml solution Take 15 mLs by mouth every 8 (eight) hours as needed for  moderate pain. Patient not taking: Reported on 01/31/2014 11/05/13   Joni ReiningNicole Pisciotta, PA-C  predniSONE (DELTASONE) 50 MG tablet Take 1 tablet daily with breakfast Patient not taking: Reported on 01/31/2014 11/05/13   Joni ReiningNicole Pisciotta, PA-C    ALLERGIES:  Allergies  Allergen Reactions  . Other Itching and Swelling    "Any melon"     SOCIAL HISTORY:  History  Substance Use Topics  . Smoking status: Former Smoker -- 1.00 packs/day for 13 years    Types: Cigars    Quit date: 05/14/2010  . Smokeless tobacco: Never Used  . Alcohol Use: No     Comment: occasionally    FAMILY HISTORY: Family History  Problem Relation Age of Onset  . Diabetes Father   . Other Mother   . Healthy Sister   . Healthy Brother     EXAM: BP 124/56 mmHg  Pulse 64  Temp(Src) 98.1 F (36.7 C) (Oral)  Resp 16  SpO2 97%  LMP 01/23/2014 CONSTITUTIONAL: Alert and oriented and responds appropriately to questions. Well-appearing; well-nourished HEAD: Normocephalic EYES: Conjunctivae clear, PERRL ENT: normal nose; no rhinorrhea; moist mucous membranes; pharynx without lesions noted NECK: Supple, no meningismus, no LAD  CARD: RRR; S1 and S2 appreciated; no murmurs, no clicks, no rubs, no gallops RESP: Normal chest excursion without splinting or tachypnea; breath sounds clear and equal bilaterally; no wheezes, no rhonchi, no rales,  ABD/GI: Normal bowel sounds; non-distended; soft, non-tender, no rebound, no guarding BACK:  The back appears  normal and is non-tender to palpation, there is no CVA tenderness; no midline spinal tenderness or step-off or deformity EXT: Tender to palpation over the anterior right knee diffusely and over the right patellar tendon, small joint effusion, no erythema or warmth, no ligamentous laxity, no calf tenderness or swelling, compartments soft, 2+ DP pulses bilaterally, patient diminished in the right lower extremity which patient reports is chronic, Normal ROM in all joints;  non-tender to palpation; no edema; normal capillary refill; no cyanosis    SKIN: Normal color for age and race; warm NEURO: Moves all extremities equally; sensation to light touch intact diffusely other than in the right leg where she reports she has some sensory loss diffusely which is chronic, cranial nerves II through XII intact, 2+ deep tendon reflexes in bilateral upper lower tenderness, patient ambulate with a limp secondary to knee pain PSYCH: The patient's mood and manner are appropriate. Grooming and personal hygiene are appropriate.  MEDICAL DECISION MAKING: Patient here with complaints of right knee pain. Concern for possible patellar tendinitis, arthritis. We'll obtain x-rays. We'll give pain medication. No signs or symptoms to suggest a septic arthritis. No calf tenderness on exam.  ED PROGRESS: Patient reports feeling better. We'll discharge with knee sleeve and crutches. X-ray show stable arthritic changes. No new bony injury or dislocation. We'll discharge with prescriptions for pain medication, anti-inflammatories. We'll have her follow-up with orthopedics as needed. Discussed instructions of rest, elevation and ice. Discussed return precautions. She verbalized understanding and is comfortable with plan.     Layla MawKristen N Jersie Beel, DO 01/31/14 [phone]

## 2014-01-31 NOTE — Discharge Instructions (Signed)
Arthritis, Nonspecific °Arthritis is inflammation of a joint. This usually means pain, redness, warmth or swelling are present. One or more joints may be involved. There are a number of types of arthritis. Your caregiver may not be able to tell what type of arthritis you have right away. °CAUSES  °The most common cause of arthritis is the wear and tear on the joint (osteoarthritis). This causes damage to the cartilage, which can break down over time. The knees, hips, back and neck are most often affected by this type of arthritis. °Other types of arthritis and common causes of joint pain include: °· Sprains and other injuries near the joint. Sometimes minor sprains and injuries cause pain and swelling that develop hours later. °· Rheumatoid arthritis. This affects hands, feet and knees. It usually affects both sides of your body at the same time. It is often associated with chronic ailments, fever, weight loss and general weakness. °· Crystal arthritis. Gout and pseudo gout can cause occasional acute severe pain, redness and swelling in the foot, ankle, or knee. °· Infectious arthritis. Bacteria can get into a joint through a break in overlying skin. This can cause infection of the joint. Bacteria and viruses can also spread through the blood and affect your joints. °· Drug, infectious and allergy reactions. Sometimes joints can become mildly painful and slightly swollen with these types of illnesses. °SYMPTOMS  °· Pain is the main symptom. °· Your joint or joints can also be red, swollen and warm or hot to the touch. °· You may have a fever with certain types of arthritis, or even feel overall ill. °· The joint with arthritis will hurt with movement. Stiffness is present with some types of arthritis. °DIAGNOSIS  °Your caregiver will suspect arthritis based on your description of your symptoms and on your exam. Testing may be needed to find the type of arthritis: °· Blood and sometimes urine tests. °· X-ray tests  and sometimes CT or MRI scans. °· Removal of fluid from the joint (arthrocentesis) is done to check for bacteria, crystals or other causes. Your caregiver (or a specialist) will numb the area over the joint with a local anesthetic, and use a needle to remove joint fluid for examination. This procedure is only minimally uncomfortable. °· Even with these tests, your caregiver may not be able to tell what kind of arthritis you have. Consultation with a specialist (rheumatologist) may be helpful. °TREATMENT  °Your caregiver will discuss with you treatment specific to your type of arthritis. If the specific type cannot be determined, then the following general recommendations may apply. °Treatment of severe joint pain includes: °· Rest. °· Elevation. °· Anti-inflammatory medication (for example, ibuprofen) may be prescribed. Avoiding activities that cause increased pain. °· Only take over-the-counter or prescription medicines for pain and discomfort as recommended by your caregiver. °· Cold packs over an inflamed joint may be used for 10 to 15 minutes every hour. Hot packs sometimes feel better, but do not use overnight. Do not use hot packs if you are diabetic without your caregiver's permission. °· A cortisone shot into arthritic joints may help reduce pain and swelling. °· Any acute arthritis that gets worse over the next 1 to 2 days needs to be looked at to be sure there is no joint infection. °Long-term arthritis treatment involves modifying activities and lifestyle to reduce joint stress jarring. This can include weight loss. Also, exercise is needed to nourish the joint cartilage and remove waste. This helps keep the muscles   around the joint strong. HOME CARE INSTRUCTIONS   Do not take aspirin to relieve pain if gout is suspected. This elevates uric acid levels.  Only take over-the-counter or prescription medicines for pain, discomfort or fever as directed by your caregiver.  Rest the joint as much as  possible.  If your joint is swollen, keep it elevated.  Use crutches if the painful joint is in your leg.  Drinking plenty of fluids may help for certain types of arthritis.  Follow your caregiver's dietary instructions.  Try low-impact exercise such as:  Swimming.  Water aerobics.  Biking.  Walking.  Morning stiffness is often relieved by a warm shower.  Put your joints through regular range-of-motion. SEEK MEDICAL CARE IF:   You do not feel better in 24 hours or are getting worse.  You have side effects to medications, or are not getting better with treatment. SEEK IMMEDIATE MEDICAL CARE IF:   You have a fever.  You develop severe joint pain, swelling or redness.  Many joints are involved and become painful and swollen.  There is severe back pain and/or leg weakness.  You have loss of bowel or bladder control. Document Released: 03/18/2004 Document Revised: 05/03/2011 Document Reviewed: 04/03/2008 [facility] Patient Information 2015 [place], [place]. This information is not intended to replace advice given to you by your health care provider. Make sure you discuss any questions you have with your health care provider.  Knee Pain The knee is the complex joint between your thigh and your lower leg. It is made up of bones, tendons, ligaments, and cartilage. The bones that make up the knee are:  The femur in the thigh.  The tibia and fibula in the lower leg.  The patella or kneecap riding in the groove on the lower femur. CAUSES  Knee pain is a common complaint with many causes. A few of these causes are:  Injury, such as:  A ruptured ligament or tendon injury.  Torn cartilage.  Medical conditions, such as:  Gout  Arthritis  Infections  Overuse, over training, or overdoing a physical activity. Knee pain can be minor or severe. Knee pain can accompany debilitating injury. Minor knee problems often respond well to self-care measures or get well on their  own. More serious injuries may need medical intervention or even surgery. SYMPTOMS The knee is complex. Symptoms of knee problems can vary widely. Some of the problems are:  Pain with movement and weight bearing.  Swelling and tenderness.  Buckling of the knee.  Inability to straighten or extend your knee.  Your knee locks and you cannot straighten it.  Warmth and redness with pain and fever.  Deformity or dislocation of the kneecap. DIAGNOSIS  Determining what is wrong may be very straight forward such as when there is an injury. It can also be challenging because of the complexity of the knee. Tests to make a diagnosis may include:  Your caregiver taking a history and doing a physical exam.  Routine X-rays can be used to rule out other problems. X-rays will not reveal a cartilage tear. Some injuries of the knee can be diagnosed by:  Arthroscopy a surgical technique by which a small video camera is inserted through tiny incisions on the sides of the knee. This procedure is used to examine and repair internal knee joint problems. Tiny instruments can be used during arthroscopy to repair the torn knee cartilage (meniscus).  Arthrography is a radiology technique. A contrast liquid is directly injected into the knee  joint. Internal structures of the knee joint then become visible on X-ray film.  An MRI scan is a non X-ray radiology procedure in which magnetic fields and a computer produce two- or three-dimensional images of the inside of the knee. Cartilage tears are often visible using an MRI scanner. MRI scans have largely replaced arthrography in diagnosing cartilage tears of the knee.  Blood work.  Examination of the fluid that helps to lubricate the knee joint (synovial fluid). This is done by taking a sample out using a needle and a syringe. TREATMENT The treatment of knee problems depends on the cause. Some of these treatments are:  Depending on the injury, proper casting,  splinting, surgery, or physical therapy care will be needed.  Give yourself adequate recovery time. Do not overuse your joints. If you begin to get sore during workout routines, back off. Slow down or do fewer repetitions.  For repetitive activities such as cycling or running, maintain your strength and nutrition.  Alternate muscle groups. For example, if you are a weight lifter, work the upper body on one day and the lower body the next.  Either tight or weak muscles do not give the proper support for your knee. Tight or weak muscles do not absorb the stress placed on the knee joint. Keep the muscles surrounding the knee strong.  Take care of mechanical problems.  If you have flat feet, orthotics or special shoes may help. See your caregiver if you need help.  Arch supports, sometimes with wedges on the inner or outer aspect of the heel, can help. These can shift pressure away from the side of the knee most bothered by osteoarthritis.  A brace called an "unloader" brace also may be used to help ease the pressure on the most arthritic side of the knee.  If your caregiver has prescribed crutches, braces, wraps or ice, use as directed. The acronym for this is PRICE. This means protection, rest, ice, compression, and elevation.  Nonsteroidal anti-inflammatory drugs (NSAIDs), can help relieve pain. But if taken immediately after an injury, they may actually increase swelling. Take NSAIDs with food in your stomach. Stop them if you develop stomach problems. Do not take these if you have a history of ulcers, stomach pain, or bleeding from the bowel. Do not take without your caregiver's approval if you have problems with fluid retention, heart failure, or kidney problems.  For ongoing knee problems, physical therapy may be helpful.  Glucosamine and chondroitin are over-the-counter dietary supplements. Both may help relieve the pain of osteoarthritis in the knee. These medicines are different from  the usual anti-inflammatory drugs. Glucosamine may decrease the rate of cartilage destruction.  Injections of a corticosteroid drug into your knee joint may help reduce the symptoms of an arthritis flare-up. They may provide pain relief that lasts a few months. You may have to wait a few months between injections. The injections do have a small increased risk of infection, water retention, and elevated blood sugar levels.  Hyaluronic acid injected into damaged joints may ease pain and provide lubrication. These injections may work by reducing inflammation. A series of shots may give relief for as long as 6 months.  Topical painkillers. Applying certain ointments to your skin may help relieve the pain and stiffness of osteoarthritis. Ask your pharmacist for suggestions. Many over the-counter products are approved for temporary relief of arthritis pain.  In some countries, doctors often prescribe topical NSAIDs for relief of chronic conditions such as arthritis and tendinitis.  A review of treatment with NSAID creams found that they worked as well as oral medications but without the serious side effects. PREVENTION  Maintain a healthy weight. Extra pounds put more strain on your joints.  Get strong, stay limber. Weak muscles are a common cause of knee injuries. Stretching is important. Include flexibility exercises in your workouts.  Be smart about exercise. If you have osteoarthritis, chronic knee pain or recurring injuries, you may need to change the way you exercise. This does not mean you have to stop being active. If your knees ache after jogging or playing basketball, consider switching to swimming, water aerobics, or other low-impact activities, at least for a few days a week. Sometimes limiting high-impact activities will provide relief.  Make sure your shoes fit well. Choose footwear that is right for your sport.  Protect your knees. Use the proper gear for knee-sensitive activities. Use  kneepads when playing volleyball or laying carpet. Buckle your seat belt every time you drive. Most shattered kneecaps occur in car accidents.  Rest when you are tired. SEEK MEDICAL CARE IF:  You have knee pain that is continual and does not seem to be getting better.  SEEK IMMEDIATE MEDICAL CARE IF:  Your knee joint feels hot to the touch and you have a high fever. MAKE SURE YOU:  Patellar Tendinitis, Jumper's Knee  Tendinitis is inflammation of a tendon. Tendonitis of the tendon below the kneecap (patella) is known as patellar tendonitis. Patellar tendonitis is a common cause of pain below the kneecap (infrapatellar). Patellar tendonitis may involve a tear (strain) in the ligament. Strains are classified into three categories. Grade 1 strains cause pain, but the tendon is not lengthened. Grade 2 strains include a lengthened ligament, due to the ligament being stretched or partially ruptured. With grade 2 strains there is still function, although function may be decreased. Grade 3 strains involve a complete tear of the tendon or muscle, and function is usually impaired. Patellar tendon strains are usually grade 1 or 2.  SYMPTOMS   Pain, tenderness, swelling, warmth, or redness over the patellar tendon (just below the kneecap).  Pain and loss of strength (sometimes), with forcefully straightening the knee (especially when jumping or rising from a seated or squatting position), or bending the knee completely (squatting or kneeling).  Crackling sound (crepitation) when the tendon is moved or touched. CAUSES  Patellar tendonitis is caused by injury to the patellar tendon. The inflammation is the body's healing response. Common causes of injury include:  Stress from a sudden increase in intensity, frequency, or duration of training.  Overuse of the thigh muscles (quadriceps) and patellar tendon.  Direct hit (trauma) to the knee or patellar tendon. RISK INCREASES WITH:  Sports that require  sudden, explosive quadriceps contraction, such as jumping, quick starts, or kicking.  Running sports, especially running down hills.  Poor strength and flexibility of the thigh and knee.  Flat feet. PREVENTION  Warm up and stretch properly before activity.  Allow for adequate recovery between workouts.  Maintain physical fitness:  Strength, flexibility, and endurance.  Cardiovascular fitness.  Protect the knee joint with taping, protective strapping, bracing, or elastic compression bandage.  Wear arch supports (orthotics). PROGNOSIS  If treated properly, patellar tendonitis usually heals within 6 weeks.  RELATED COMPLICATIONS   Longer healing time if not properly treated or if not given enough time to heal.  Recurring symptoms if activity is resumed too soon, with overuse, with a direct blow, or when using poor  technique.  If untreated, tendon rupture requiring surgery. TREATMENT Treatment first involves the use of ice and medicine to reduce pain and inflammation. The use of strengthening and stretching exercises may help reduce pain with activity. These exercises may be performed at home or with a therapist. Serious cases of tendonitis may require restraining the knee for 10 to 14 days to prevent stress on the tendon and to promote healing. Crutches may be used (uncommon) until you can walk without a limp. For cases in which nonsurgical treatment is unsuccessful, surgery may be advised to remove the inflamed tendon lining (sheath). Surgery is rare, and is only advised after at least 6 months of nonsurgical treatment. MEDICATION   If pain medicine is needed, nonsteroidal anti-inflammatory medicines (aspirin and ibuprofen), or other minor pain relievers (acetaminophen), are often advised.  Do not take pain medicine for 7 days before surgery.  Prescription pain relievers may be given if your caregiver thinks they are needed. Use only as directed and only as much as you  need. HEAT AND COLD  Cold treatment (icing) should be applied for 10 to 15 minutes every 2 to 3 hours for inflammation and pain, and immediately after activity that aggravates your symptoms. Use ice packs or an ice massage.  Heat treatment may be used before performing stretching and strengthening activities prescribed by your caregiver, physical therapist, or athletic trainer. Use a heat pack or a warm water soak. SEEK MEDICAL CARE IF:  Symptoms get worse or do not improve in 2 weeks, despite treatment.  New, unexplained symptoms develop. (Drugs used in treatment may produce side effects.) EXERCISES RANGE OF MOTION (ROM) AND STRETCHING EXERCISES - Patellar Tendinitis (Jumper's Knee) These are some of the initial exercises with which you may start your rehabilitation program, until you see your caregiver again or until your symptoms are resolved. Remember:   Flexible tissue is more tolerant of the stresses placed on it during activities.  Each stretch should be held for 20 to 30 seconds.  A gentle stretching sensation should be felt. STRETCH - Hamstrings, Supine  Lie on your back. Loop a belt or towel over the ball of your right / left foot.  Straighten your right / left knee and slowly pull on the belt to raise your leg. Do not allow the right / left knee to bend. Keep your opposite leg flat on the floor.  Raise the leg until you feel a gentle stretch behind your right / left knee or thigh. Hold this position for __________ seconds. Repeat __________ times. Complete this stretch __________ times per day.  STRETCH - Hamstrings, Doorway  Lie on your back with your right / left leg extended and resting on the wall, and the opposite leg flat on the ground through the door. At first, position your bottom farther away from the wall.  Keep your right / left knee straight. If you feel a stretch behind your knee or thigh, hold this position for __________ seconds.  If you do not feel a  stretch, scoot your bottom closer to the door, and hold __________ seconds. Repeat __________ times. Complete this stretch __________ times per day.  STRETCH - Hamstrings, Standing  Stand or sit and extend your right / left leg, placing your foot on a chair or foot stool.  Keep a slight arch in your low back and your hips straight forward.  Lead with your chest and lean forward at the waist until you feel a gentle stretch in the back of  your right / left knee or thigh. (When done correctly, this exercise requires leaning only a small distance.)  Hold this position for __________ seconds. Repeat __________ times. Complete this stretch __________ times per day. STRETCH - Adductors, Lunge  While standing, spread your legs, with your right / left leg behind you.  Lean away from your right / left leg by bending your opposite knee. You may rest your hands on your thigh for balance.  You should feel a stretch in your right / left inner thigh. Hold for __________ seconds. Repeat __________ times. Complete this exercise __________ times per day.  STRENGTHENING EXERCISES - Patellar Tendinitis (Jumper's Knee) These exercises may help you when beginning to rehabilitate your injury. They may resolve your symptoms with or without further involvement from your physician, physical therapist or athletic trainer. While completing these exercises, remember:   Muscles can gain both the endurance and the strength needed for everyday activities through controlled exercises.  Complete these exercises as instructed by your physician, physical therapist or athletic trainer. Increase the resistance and repetitions only as guided by your caregiver. STRENGTH - Quadriceps, Isometrics  Lie on your back with your right / left leg extended and your opposite knee bent.  Gradually tense the muscles in the front of your right / left thigh. You should see either your kneecap slide up toward your hip or increased dimpling  just above the knee. This motion will push the back of the knee down toward the floor, mat, or bed on which you are lying.  Hold the muscle as tight as you can, without increasing your pain, for __________ seconds.  Relax the muscles slowly and completely in between each repetition. Repeat __________ times. Complete this exercise __________ times per day.  STRENGTH - Quadriceps, Short Arcs  Lie on your back. Place a __________ inch towel roll under your right / left knee, so that the knee bends slightly.  Raise only your lower leg by tightening the muscles in the front of your thigh. Do not allow your thigh to rise.  Hold this position for __________ seconds. Repeat __________ times. Complete this exercise __________ times per day.  OPTIONAL ANKLE WEIGHTS: Begin with ____________________, but DO NOT exceed ____________________. Increase in 1 pound/ 0.5 kilogram increments. STRENGTH - Quadriceps, Straight Leg Raises  Quality counts! Watch for signs that the quadriceps muscle is working, to be sure you are strengthening the correct muscles and not "cheating" by substituting with healthier muscles.  Lay on your back with your right / left leg extended and your opposite knee bent.  Tense the muscles in the front of your right / left thigh. You should see either your kneecap slide up or increased dimpling just above the knee. Your thigh may even shake a bit.  Tighten these muscles even more and raise your leg 4 to 6 inches off the floor. Hold for __________ seconds.  Keeping these muscles tense, lower your leg.  Relax the muscles slowly and completely between each repetition. Repeat __________ times. Complete this exercise __________ times per day.  STRENGTH - Quadriceps, Squats  Stand in a door frame so that your feet and knees are in line with the frame.  Use your hands for balance, not support, on the frame.  Slowly lower your weight, bending at the hips and knees. Keep your lower  legs upright so that they are parallel with the door frame. Squat only within the range that does not increase your knee pain. Never let your  hips drop below your knees.  Slowly return upright, pushing with your legs, not pulling with your hands. Repeat __________ times. Complete this exercise __________ times per day.  STRENGTH - Quadriceps, Step-Downs  Stand on the edge of a step stool or stair. Be prepared to use a countertop or wall for balance, if needed.  Keeping your right / left knee directly over the middle of your foot, slowly touch your opposite heel to the floor or lower step. Do not go all the way to the floor if your knee pain increases; just go as far as you can without increased discomfort. Use your right / left leg muscles, not gravity to lower your body weight.  Slowly push your body weight back up to the starting position. Repeat __________ times. Complete this exercise __________ times per day.  Document Released: 02/08/2005 Document Revised: 06/25/2013 Document Reviewed: 05/23/2008 [facility] Patient Information 2015 [place], [place]. This information is not intended to replace advice given to you by your health care provider. Make sure you discuss any questions you have with your health care provider.   Understand these instructions.  Will watch your condition.  Will get help right away if you are not doing well or get worse. Document Released: 12/06/2006 Document Revised: 05/03/2011 Document Reviewed: 12/06/2006 [facility] Patient Information 2015 [place], [place]. This information is not intended to replace advice given to you by your health care provider. Make sure you discuss any questions you have with your health care provider.

## 2014-01-31 NOTE — ED Notes (Signed)
Ward, MD at bedside. 

## 2014-01-31 NOTE — ED Notes (Signed)
Per Ward, MD pt has had numbness in her leg before due to sciatica.

## 2014-06-10 ENCOUNTER — Emergency Department (HOSPITAL_COMMUNITY)
Admission: EM | Admit: 2014-06-10 | Discharge: 2014-06-10 | Disposition: A | Discharge status: Home / Self Care | Payer: Self-pay | Attending: Emergency Medicine | Admitting: Emergency Medicine

## 2014-06-10 ENCOUNTER — Encounter (HOSPITAL_COMMUNITY): Payer: Self-pay | Admitting: Nurse Practitioner

## 2014-06-10 ENCOUNTER — Emergency Department (HOSPITAL_COMMUNITY): Payer: Self-pay

## 2014-06-10 DIAGNOSIS — Z8659 Personal history of other mental and behavioral disorders: Secondary | ICD-10-CM | POA: Insufficient documentation

## 2014-06-10 DIAGNOSIS — M10071 Idiopathic gout, right ankle and foot: Secondary | ICD-10-CM

## 2014-06-10 DIAGNOSIS — Z8709 Personal history of other diseases of the respiratory system: Secondary | ICD-10-CM | POA: Insufficient documentation

## 2014-06-10 DIAGNOSIS — Z87891 Personal history of nicotine dependence: Secondary | ICD-10-CM | POA: Insufficient documentation

## 2014-06-10 DIAGNOSIS — M109 Gout, unspecified: Secondary | ICD-10-CM | POA: Insufficient documentation

## 2014-06-10 DIAGNOSIS — M19079 Primary osteoarthritis, unspecified ankle and foot: Secondary | ICD-10-CM | POA: Insufficient documentation

## 2014-06-10 DIAGNOSIS — Z8669 Personal history of other diseases of the nervous system and sense organs: Secondary | ICD-10-CM | POA: Insufficient documentation

## 2014-06-10 MED ORDER — COLCHICINE 0.6 MG PO TABS
1.2000 mg | ORAL_TABLET | Freq: Once | ORAL | Status: AC
  Administered 2014-06-10: 1.2 mg via ORAL
  Filled 2014-06-10: qty 2

## 2014-06-10 MED ORDER — INDOMETHACIN 25 MG PO CAPS: 25.0000 mg | ORAL_CAPSULE | Freq: Three times a day (TID) | ORAL | Status: DC | PRN

## 2014-06-10 MED ORDER — TRAMADOL HCL 50 MG PO TABS: 50.0000 mg | ORAL_TABLET | Freq: Four times a day (QID) | ORAL | Status: DC | PRN

## 2014-06-10 MED ORDER — COLCHICINE 0.6 MG PO TABS: 0.6000 mg | ORAL_TABLET | Freq: Every day | ORAL | Status: DC

## 2014-06-10 NOTE — ED Notes (Addendum)
She c/o right great toe pain and swelling onset this morning. No redness. She denies any injury

## 2014-06-10 NOTE — ED Provider Notes (Signed)
CSN: [id number]     Arrival date & time 06/10/14  1719 History  This chart is scribed for non-physician practitioner, Junius FinnerErin O'Malley, PA-C, working with Arby BarretteMarcy Pfeiffer, MD by Abel PrestoKara Demonbreun, ED Scribe.  This patient was seen in room TR08C/TR08C and the patient's care was started 7:22 PM.     Chief Complaint  Patient presents with  . Toe Pain    Patient is a 44 y.o. female presenting with toe pain. The history is provided by the patient. No language interpreter was used.  Toe Pain   HPI Comments: Susan Wang is a 44 y.o. female who presents to the Emergency Department complaining of pain to right great toe with onset this morning. Pt notes associated swelling.  Pain in toe is 10/10 at worst. No pain meds PTA.  Pt denies h/o gout. Pt stands for prolonged periods of time at work.  Pt has NKDA. Pt denies any known injury, fever, nausea, and vomiting. Pt has no PCP.   Past Medical History  Diagnosis Date  . Arthritis   . DJD (degenerative joint disease)   . Osteoarthritis     knees  . Bronchitis   . Plantar fasciitis   . Chest pain 09/2012  . Anxiety   . Shortness of breath   . Carpal tunnel syndrome   . DDD (degenerative disc disease)   . Carpal tunnel syndrome    Past Surgical History  Procedure Laterality Date  . Cholecystectomy     Family History  Problem Relation Age of Onset  . Diabetes Father   . Other Mother   . Healthy Sister   . Healthy Brother    History  Substance Use Topics  . Smoking status: Former Smoker -- 1.00 packs/day for 13 years    Types: Cigars    Quit date: 05/14/2010  . Smokeless tobacco: Never Used  . Alcohol Use: No     Comment: occasionally   OB History    No data available     Review of Systems  Constitutional: Negative for fever.  Gastrointestinal: Negative for nausea and vomiting.  Musculoskeletal: Positive for myalgias and joint swelling.  All other systems reviewed and are negative.    Allergies  Other  Home Medications    Prior to Admission medications   Medication Sig Start Date End Date Taking? Authorizing Provider  colchicine 0.6 MG tablet Take 1 tablet (0.6 mg total) by mouth daily. 06/10/14   Junius FinnerErin O'Malley, PA-C  HYDROcodone-acetaminophen (NORCO/VICODIN) 5-325 MG per tablet Take 1 tablet by mouth every 4 (four) hours as needed. 01/31/14   Kristen N Ward, DO  indomethacin (INDOCIN) 25 MG capsule Take 1 capsule (25 mg total) by mouth 3 (three) times daily as needed. 06/10/14   Junius FinnerErin O'Malley, PA-C  traMADol (ULTRAM) 50 MG tablet Take 1 tablet (50 mg total) by mouth every 6 (six) hours as needed. 06/10/14   Junius FinnerErin O'Malley, PA-C   BP 149/79 mmHg  Pulse 60  Temp(Src) 98.5 F (36.9 C) (Oral)  Resp 18  Ht 5' (1.524 m)  Wt 187 lb 11.2 oz (85.14 kg)  BMI 36.66 kg/m2  SpO2 97%  LMP 05/28/2014 Physical Exam  Constitutional: She is oriented to person, place, and time. She appears well-developed and well-nourished.  HENT:  Head: Normocephalic and atraumatic.  Eyes: EOM are normal.  Neck: Normal range of motion.  Cardiovascular: Normal rate.   Cap refill <3 sec  Pulmonary/Chest: Effort normal.  Musculoskeletal: Normal range of motion.  Right great toe: mild edema  with severe tenderness to light touch; full ROM.  (see skin exam)  Neurological: She is alert and oriented to person, place, and time.  Skin: Skin is warm and dry.  Skin intact; no erythema or warmth; no ecchymosis; no nail bed involvement  Psychiatric: She has a normal mood and affect. Her behavior is normal.  Nursing note and vitals reviewed.   ED Course  Procedures (including critical care time) DIAGNOSTIC STUDIES: Oxygen Saturation is 97% on room air, normal by my interpretation.    COORDINATION OF CARE: 7:25 PM Discussed treatment plan with patient at beside, the patient agrees with the plan and has no further questions at this time.   Labs Review Labs Reviewed - No data to display  Imaging Review Dg Toe Great Right  06/10/2014    CLINICAL DATA:  Great toe pain  EXAM: RIGHT GREAT TOE  COMPARISON:  None.  FINDINGS: No acute fracture.  No dislocation.  IMPRESSION: No acute bony pathology.   Electronically Signed   By: Jolaine Click M.D.   On: 06/10/2014 19:14     EKG Interpretation None     Meds ordered this encounter  Medications  . colchicine tablet 1.2 mg    Sig:   . colchicine 0.6 MG tablet    Sig: Take 1 tablet (0.6 mg total) by mouth daily.    Dispense:  3 tablet    Refill:  0    Order Specific Question:  Supervising Provider    Answer:  MILLER, BRIAN [[id number]]  . traMADol (ULTRAM) 50 MG tablet    Sig: Take 1 tablet (50 mg total) by mouth every 6 (six) hours as needed.    Dispense:  15 tablet    Refill:  0    Order Specific Question:  Supervising Provider    Answer:  MILLER, BRIAN [[id number]]  . indomethacin (INDOCIN) 25 MG capsule    Sig: Take 1 capsule (25 mg total) by mouth 3 (three) times daily as needed.    Dispense:  30 capsule    Refill:  0    Order Specific Question:  Supervising Provider    Answer:  Eber Hong [[id number]]    MDM   Final diagnoses:  Gouty arthritis of toe, right   Pt is a 44yo female c/o sudden onset Right great toe pain.  On exam, pain with light touch c/w gout flare. No previous hx of gout. No evidence of underlying infection. Plain films: no acute bony pathology.  Will tx with colchicine, tramadol and indomethocin. Home care instructions provided. Advised to f/u with [facility] for recheck of symptoms.  Return precautions provided. Pt verbalized understanding and agreement with tx plan.    I personally performed the services described in this documentation, which was scribed in my presence. The recorded information has been reviewed and is accurate.      Junius Finner, PA-C 06/10/14 1956  Arby Barrette, MD 06/11/14 [phone]

## 2014-06-10 NOTE — Discharge Instructions (Signed)

## 2014-09-10 ENCOUNTER — Emergency Department (HOSPITAL_COMMUNITY): Payer: Self-pay

## 2014-09-10 ENCOUNTER — Emergency Department (HOSPITAL_COMMUNITY)
Admission: EM | Admit: 2014-09-10 | Discharge: 2014-09-10 | Disposition: A | Discharge status: Home / Self Care | Payer: Self-pay | Attending: Emergency Medicine | Admitting: Emergency Medicine

## 2014-09-10 ENCOUNTER — Encounter (HOSPITAL_COMMUNITY): Payer: Self-pay | Admitting: Physical Medicine and Rehabilitation

## 2014-09-10 DIAGNOSIS — Z3202 Encounter for pregnancy test, result negative: Secondary | ICD-10-CM | POA: Insufficient documentation

## 2014-09-10 DIAGNOSIS — Z8709 Personal history of other diseases of the respiratory system: Secondary | ICD-10-CM | POA: Insufficient documentation

## 2014-09-10 DIAGNOSIS — Z79899 Other long term (current) drug therapy: Secondary | ICD-10-CM | POA: Insufficient documentation

## 2014-09-10 DIAGNOSIS — Z8669 Personal history of other diseases of the nervous system and sense organs: Secondary | ICD-10-CM | POA: Insufficient documentation

## 2014-09-10 DIAGNOSIS — Z87891 Personal history of nicotine dependence: Secondary | ICD-10-CM | POA: Insufficient documentation

## 2014-09-10 DIAGNOSIS — F419 Anxiety disorder, unspecified: Secondary | ICD-10-CM | POA: Insufficient documentation

## 2014-09-10 DIAGNOSIS — M1990 Unspecified osteoarthritis, unspecified site: Secondary | ICD-10-CM | POA: Insufficient documentation

## 2014-09-10 DIAGNOSIS — K5900 Constipation, unspecified: Secondary | ICD-10-CM | POA: Insufficient documentation

## 2014-09-10 LAB — COMPREHENSIVE METABOLIC PANEL
ALBUMIN: 3.8 g/dL (ref 3.5–5.0)
ALT: 20 U/L (ref 14–54)
AST: 25 U/L (ref 15–41)
Alkaline Phosphatase: 52 U/L (ref 38–126)
Anion gap: 7 (ref 5–15)
BUN: 11 mg/dL (ref 6–20)
CO2: 24 mmol/L (ref 22–32)
Calcium: 8.8 mg/dL — ABNORMAL LOW (ref 8.9–10.3)
Chloride: 108 mmol/L (ref 101–111)
Creatinine, Ser: 0.83 mg/dL (ref 0.44–1.00)
GFR calc Af Amer: >60 mL/min (ref >60)
GFR calc non Af Amer: >60 mL/min (ref >60)
GLUCOSE: 92 mg/dL (ref 65–99)
Potassium: 4 mmol/L (ref 3.5–5.1)
SODIUM: 139 mmol/L (ref 135–145)
TOTAL PROTEIN: 6.4 g/dL — AB (ref 6.5–8.1)
Total Bilirubin: 0.8 mg/dL (ref 0.3–1.2)

## 2014-09-10 LAB — URINALYSIS, ROUTINE W REFLEX MICROSCOPIC
BILIRUBIN URINE: NEGATIVE
Glucose, UA: NEGATIVE mg/dL
Ketones, ur: NEGATIVE mg/dL
NITRITE: NEGATIVE
PH: 5 (ref 5.0–8.0)
Protein, ur: NEGATIVE mg/dL
Specific Gravity, Urine: 1.023 (ref 1.005–1.030)
Urobilinogen, UA: 0.2 mg/dL (ref 0.0–1.0)

## 2014-09-10 LAB — CBC WITH DIFFERENTIAL/PLATELET
BASOS PCT: 1 % (ref 0–1)
Basophils Absolute: 0.1 10*3/uL (ref 0.0–0.1)
Eosinophils Absolute: 0.3 10*3/uL (ref 0.0–0.7)
Eosinophils Relative: 4 % (ref 0–5)
HCT: 39.2 % (ref 36.0–46.0)
Hemoglobin: 13.1 g/dL (ref 12.0–15.0)
LYMPHS ABS: 3.2 10*3/uL (ref 0.7–4.0)
Lymphocytes Relative: 49 % — ABNORMAL HIGH (ref 12–46)
MCH: 32.4 pg (ref 26.0–34.0)
MCHC: 33.4 g/dL (ref 30.0–36.0)
MCV: 97 fL (ref 78.0–100.0)
MONOS PCT: 10 % (ref 3–12)
Monocytes Absolute: 0.7 10*3/uL (ref 0.1–1.0)
Neutro Abs: 2.3 10*3/uL (ref 1.7–7.7)
Neutrophils Relative %: 36 % — ABNORMAL LOW (ref 43–77)
Platelets: 167 10*3/uL (ref 150–400)
RBC: 4.04 MIL/uL (ref 3.87–5.11)
RDW: 13.3 % (ref 11.5–15.5)
WBC: 6.4 10*3/uL (ref 4.0–10.5)

## 2014-09-10 LAB — LIPASE, BLOOD: LIPASE: 23 U/L (ref 22–51)

## 2014-09-10 LAB — URINE MICROSCOPIC-ADD ON

## 2014-09-10 LAB — POC OCCULT BLOOD, ED: Fecal Occult Bld: NEGATIVE

## 2014-09-10 LAB — I-STAT BETA HCG BLOOD, ED (MC, WL, AP ONLY)

## 2014-09-10 MED ORDER — POLYETHYLENE GLYCOL 3350 17 G PO PACK: 17.0000 g | PACK | Freq: Every day | ORAL | Status: DC | PRN

## 2014-09-10 MED ORDER — BISACODYL 10 MG RE SUPP
10.0000 mg | Freq: Once | RECTAL | Status: AC
  Administered 2014-09-10: 10 mg via RECTAL
  Filled 2014-09-10: qty 1

## 2014-09-10 MED ORDER — DICYCLOMINE HCL 20 MG PO TABS: 20.0000 mg | ORAL_TABLET | Freq: Two times a day (BID) | ORAL | Status: DC | PRN

## 2014-09-10 MED ORDER — KETOROLAC TROMETHAMINE 60 MG/2ML IM SOLN
60.0000 mg | Freq: Once | INTRAMUSCULAR | Status: AC
  Administered 2014-09-10: 60 mg via INTRAMUSCULAR
  Filled 2014-09-10: qty 2

## 2014-09-10 MED ORDER — LACTULOSE 10 GM/15ML PO SOLN
20.0000 g | Freq: Once | ORAL | Status: AC
  Administered 2014-09-10: 20 g via ORAL
  Filled 2014-09-10: qty 30

## 2014-09-10 NOTE — Discharge Instructions (Signed)
Read the information below.  Use the prescribed medication as directed.  Please discuss all new medications with your pharmacist.  You may return to the Emergency Department at any time for worsening condition or any new symptoms that concern you.  If you develop high fevers, worsening abdominal pain, uncontrolled vomiting, or are unable to tolerate fluids by mouth, return to the ER for a recheck.      Constipation Constipation is when a person has fewer than three bowel movements a week, has difficulty having a bowel movement, or has stools that are dry, hard, or larger than normal. As people grow older, constipation is more common. If you try to fix constipation with medicines that make you have a bowel movement (laxatives), the problem may get worse. Long-term laxative use may cause the muscles of the colon to become weak. A low-fiber diet, not taking in enough fluids, and taking certain medicines may make constipation worse.  CAUSES   Certain medicines, such as antidepressants, pain medicine, iron supplements, antacids, and water pills.   Certain diseases, such as diabetes, irritable bowel syndrome (IBS), thyroid disease, or depression.   Not drinking enough water.   Not eating enough fiber-rich foods.   Stress or travel.   Lack of physical activity or exercise.   Ignoring the urge to have a bowel movement.   Using laxatives too much.  SIGNS AND SYMPTOMS   Having fewer than three bowel movements a week.   Straining to have a bowel movement.   Having stools that are hard, dry, or larger than normal.   Feeling full or bloated.   Pain in the lower abdomen.   Not feeling relief after having a bowel movement.  DIAGNOSIS  Your health care provider will take a medical history and perform a physical exam. Further testing may be done for severe constipation. Some tests may include:  A barium enema X-ray to examine your rectum, colon, and, sometimes, your small  intestine.   A sigmoidoscopy to examine your lower colon.   A colonoscopy to examine your entire colon. TREATMENT  Treatment will depend on the severity of your constipation and what is causing it. Some dietary treatments include drinking more fluids and eating more fiber-rich foods. Lifestyle treatments may include regular exercise. If these diet and lifestyle recommendations do not help, your health care provider may recommend taking over-the-counter laxative medicines to help you have bowel movements. Prescription medicines may be prescribed if over-the-counter medicines do not work.  HOME CARE INSTRUCTIONS   Eat foods that have a lot of fiber, such as fruits, vegetables, whole grains, and beans.  Limit foods high in fat and processed sugars, such as french fries, hamburgers, cookies, candies, and soda.   A fiber supplement may be added to your diet if you cannot get enough fiber from foods.   Drink enough fluids to keep your urine clear or pale yellow.   Exercise regularly or as directed by your health care provider.   Go to the restroom when you have the urge to go. Do not hold it.   Only take over-the-counter or prescription medicines as directed by your health care provider. Do not take other medicines for constipation without talking to your health care provider first.  SEEK IMMEDIATE MEDICAL CARE IF:   You have bright red blood in your stool.   Your constipation lasts for more than 4 days or gets worse.   You have abdominal or rectal pain.   You have thin, pencil-like stools.  You have unexplained weight loss. MAKE SURE YOU:   Understand these instructions.  Will watch your condition.  Will get help right away if you are not doing well or get worse. Document Released: 11/07/2003 Document Revised: 02/13/2013 Document Reviewed: 11/20/2012 [facility] Patient Information 2015 [place], [place]. This information is not intended to replace advice given to you by  your health care provider. Make sure you discuss any questions you have with your health care provider.    Emergency Department Resource Guide 1) Find a Doctor and Pay Out of Pocket Although you won't have to find out who is covered by your insurance plan, it is a good idea to ask around and get recommendations. You will then need to call the office and see if the doctor you have chosen will accept you as a new patient and what types of options they offer for patients who are self-pay. Some doctors offer discounts or will set up payment plans for their patients who do not have insurance, but you will need to ask so you aren't surprised when you get to your appointment.  2) Contact Your Local Health Department Not all health departments have doctors that can see patients for sick visits, but many do, so it is worth a call to see if yours does. If you don't know where your local health department is, you can check in your phone book. The CDC also has a tool to help you locate your state's health department, and many state websites also have listings of all of their local health departments.  3) Find a Walk-in Clinic If your illness is not likely to be very severe or complicated, you may want to try a walk in clinic. These are popping up all over the country in pharmacies, drugstores, and shopping centers. They're usually staffed by nurse practitioners or physician assistants that have been trained to treat common illnesses and complaints. They're usually fairly quick and inexpensive. However, if you have serious medical issues or chronic medical problems, these are probably not your best option.  No Primary Care Doctor: - Call Health Connect at  [phone] - they can help you locate a primary care doctor that  accepts your insurance, provides certain services, etc. - Physician Referral Service- [phone]  Chronic Pain Problems: Organization         Address  Phone   Notes  [facility]  [phone] Patients need to be referred by their primary care doctor.   Medication Assistance: Organization         Address  Phone   Notes  [facility] Medication [facility] [address] [place], [place] [address] [phone] --Must be a resident of [facility] -- Must have NO insurance coverage whatsoever (no Medicaid/ Medicare, etc.) -- The pt. MUST have a primary care doctor that directs their care regularly and follows them in the community   [facility]  [phone]   [employer]  [phone]    Agencies that provide inexpensive medical care: Organization         Address  Phone   Notes  [facility]  [phone]   [facility]    [phone]   [facility] [address] [place], [place] [address] [phone]   [facility] [address] [place] [place] [phone]   Planned Parenthood    [phone]   [facility]    [phone]   [facility] and [facility]  [address], [place] Phone:  [phone], Fax:  [phone] Hours of Operation:  9 am - 6 pm, M-F.  Also accepts Medicaid/Medicare and self-pay.  [facility]  [address], [place] Phone: [phone], Fax: [phone]. Hours of Operation:  8:30 am - 5:30 pm, M-F.  Also accepts Medicaid and self-pay.  [facility] [place] [address], [place] Phone: [phone]   [facility] [address], [place], [place] [phone], Ext. [phone] Mondays & Thursdays: 7-9 AM.  First 15 patients are seen on a first come, first serve basis.    Medicaid-accepting [facility] Providers:  Organization         Address  Phone   Notes  [facility] [address], [place] [phone] Also accepts self-pay patients.  [facility] [address], [place]  [phone]   [facility] [address], [place] [phone]   [facility] [address] [place] [address], [place] [phone]   Renaye RakersVeita Bland [address], [place]   [phone] Only accepts [place] Access [place] patients after they have their name applied to their card.   Self-Pay (no insurance) in [facility] [place]:  Organization         Address  Phone   Notes  Sickle Cell Patients, [facility] [address], [place] [phone]   [facility] [address], [place] [phone]   [facility] [place]  [address], [place] [phone]   [facility]/Dr. Osei-Bonsu  [address] [place] [address], [place] or [phone], [place] [phone] Phone number for both [place] and [place] locations is the same.  [facility]gent Medical and [facility] [address], [place] [phone]   [facility] [place] [address] [place] [address], [place] or [address] [phone]   [facility] [address], [place] [phone], phone; [phone], fax Sees patients 1st and 3rd Saturday of every month.  Must not qualify for public or private insurance (i.e. Medicaid, Medicare, [place] Health Choice, Veterans' Benefits)  Household income should be no more than 200% of the poverty level The clinic cannot treat you if you are pregnant or think you are pregnant  Sexually transmitted diseases are not treated at the clinic.    Dental Care: Organization         Address  Phone  Notes  [facility] of [facility] [address], [place] [phone] Accepts children up to age 45 who are enrolled in [place] or [place] Health Choice; pregnant women with a Medicaid card; and children who have applied for Medicaid or [place]  Health Choice, but were declined, whose parents can pay a reduced fee at time of service.  [facility] [facility] of [facility] [place]  [address], [place] [phone] Accepts children up to age 45 who are enrolled in [place] or [place] Health Choice; pregnant women with a Medicaid card; and children who have applied for Medicaid or  Health Choice, but were declined, whose parents can pay a reduced fee at time of service.  [facility]  [address] [place], [place] [phone] Patients are seen by appointment only. Walk-ins are not accepted. [facility] will see patients 47 years of age and older. Monday - Tuesday (8am-5pm) Most Wednesdays (8:30-5pm) $30 per visit, cash only  [facility] Adult [facility] Access PROGRAM  [address], [facility] [phone] Patients are seen by appointment only. Walk-ins are not accepted. [facility] will see patients 19 years of age and older. One Wednesday Evening (Monthly: Volunteer Based).  $30 per visit, cash only  [employer]  [phone] for adults; Children under age 36, call Graduate Pediatric Dentistry at [phone]. Children aged 30-14, please call [phone] to request a pediatric application.  Dental services are provided in all areas of dental care including fillings, crowns and bridges, complete and partial dentures, implants, gum treatment, root canals, and extractions. Preventive care is also provided. Treatment is provided to both adults and children. Patients are selected via a lottery and there is often a waiting list.   [facility] [address], [place]  [phone] [email or web address]   [facility] [address] [place], [place] [phone], Ext. [phone] Second and Fourth Thursday of each month, opens at 6:30 AM; Clinic ends at 9 AM.  Patients are seen on a first-come first-served basis, and a limited number are seen during  each clinic.   [facility]  [address] [place], [place] [phone]   Eligibility Requirements You must have lived in [place], [place], or [place] counties for at least the last three months.   You cannot be eligible for state or federal sponsored [place], including [employer], [place], or [employer].   You generally cannot be eligible for healthcare insurance through your employer.    How to apply: Eligibility screenings are held every Tuesday and Wednesday afternoon from 1:00 pm until 4:00 pm. You do not need an appointment for the interview!  [facility] [address] [place], [place] [phone]   Emylee Decelle [facility] Health Department  [phone]   [facility] Health Department  [phone]   [facility] Health Department  [phone]    Behavioral Health Resources in the Community: Intensive Outpatient Programs Organization         Address  Phone  Notes  [facility] [address], [place], [place] [phone]   [facility]patient [address] [place], [place] [phone]   [facility]: [facility] [address] [place], [place]  [phone]   [facility] Mental Health [address]  [place], [place] [phone] or [phone]   Substance Abuse Resources Organization         Address  Phone  Notes  Alcohol and Drug Services  [phone]   [facility]  [phone]   [facility]  [phone]   Floydene Flock  [phone]   Residential & Outpatient Substance Abuse Program  [phone]   Psychological Services Organization         Address  Phone  Notes  [facility] Behavioral Health  [phone]   [facility] Services  [phone]   [facility] Mental Health [address] [place] [phone] or [phone]    Mobile Crisis Teams Organization         Address  Phone  Notes  [facility], Mobile  Crisis Care Unit  [phone]   [facility]  [address] [place],  [place] [phone]   [facility] [address] [place] [phone]    Self-Help/Support Groups Organization         Address  Phone             Notes  [facility] [place] - variety of support groups  [phone] Call for more information  Narcotics Anonymous (NA), Caring [facility] [address], [employer] [place]  2 meetings at this location   Statistician         Address  Phone  Notes  [facility] [address],    [place]  [phone]   [facility]  [address] [place] [address], [place], [place] [phone]   [facility] [address] [place], [place] [phone] Admissions: 8am-3pm M-F  [facility] [address],    [place], [place] [phone]   [facility] [address] [place], [place], [place] [phone]   [facility] [address]  [place], [place] [phone]   [facility] - Intensive Outpatient [address], [place], [place] [phone]   [facility] ([facility] [address]  [place], [place] [phone] or [phone]   [facility] Services (RTS) [address] [place], [place] [phone] Accepts Medicaid  [facility] [place] [address],  [place] [phone] Substance Abuse/Addiction Treatment   [facility] Organization         Address  Phone  Notes  [facility]  [phone]   Angie Fava, PhD [address] Kn[address] [place], [place]   [phone] or [phone]   [facility] Behavioral   [address] [place], [place] [phone]   [facility] [address] [place], [place] [phone] Insurance/Medicaid/sponsorship through [facility] Families [address]                                    [place], [place] [phone] Therapy/tele-psych/case  [facility] [address], [place] [phone]    Dr. Lolly Mustache  [phone]   [facility] [place]  [employer] [facility] Dept. 1) [address] [place] 2) [address] [place] 3)  [address], [place] [phone]  [phone]   [facility] Child Abuse Hotline [phone] or [phone] (After Hours)

## 2014-09-10 NOTE — ED Notes (Signed)
Patient transported to X-ray 
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
## 2014-09-10 NOTE — ED Notes (Signed)
Pt presents to department for evaluation of R sided abdominal pain and constipation. Ongoing for several days. Last BM on Friday was hard. 7/10 R sided abdominal pain upon arrival. No nausea/vomiting. Pt is alert and oriented x4.

## 2014-10-16 IMAGING — CR DG HIP (WITH OR WITHOUT PELVIS) 2-3V*L*
3 series · 3 of 3 positions shown · non-contrast
Comparison: None.

CLINICAL DATA: Left hip pain without injury

EXAM:
LEFT HIP - COMPLETE 2+ VIEW

[t pelvis a.p.]
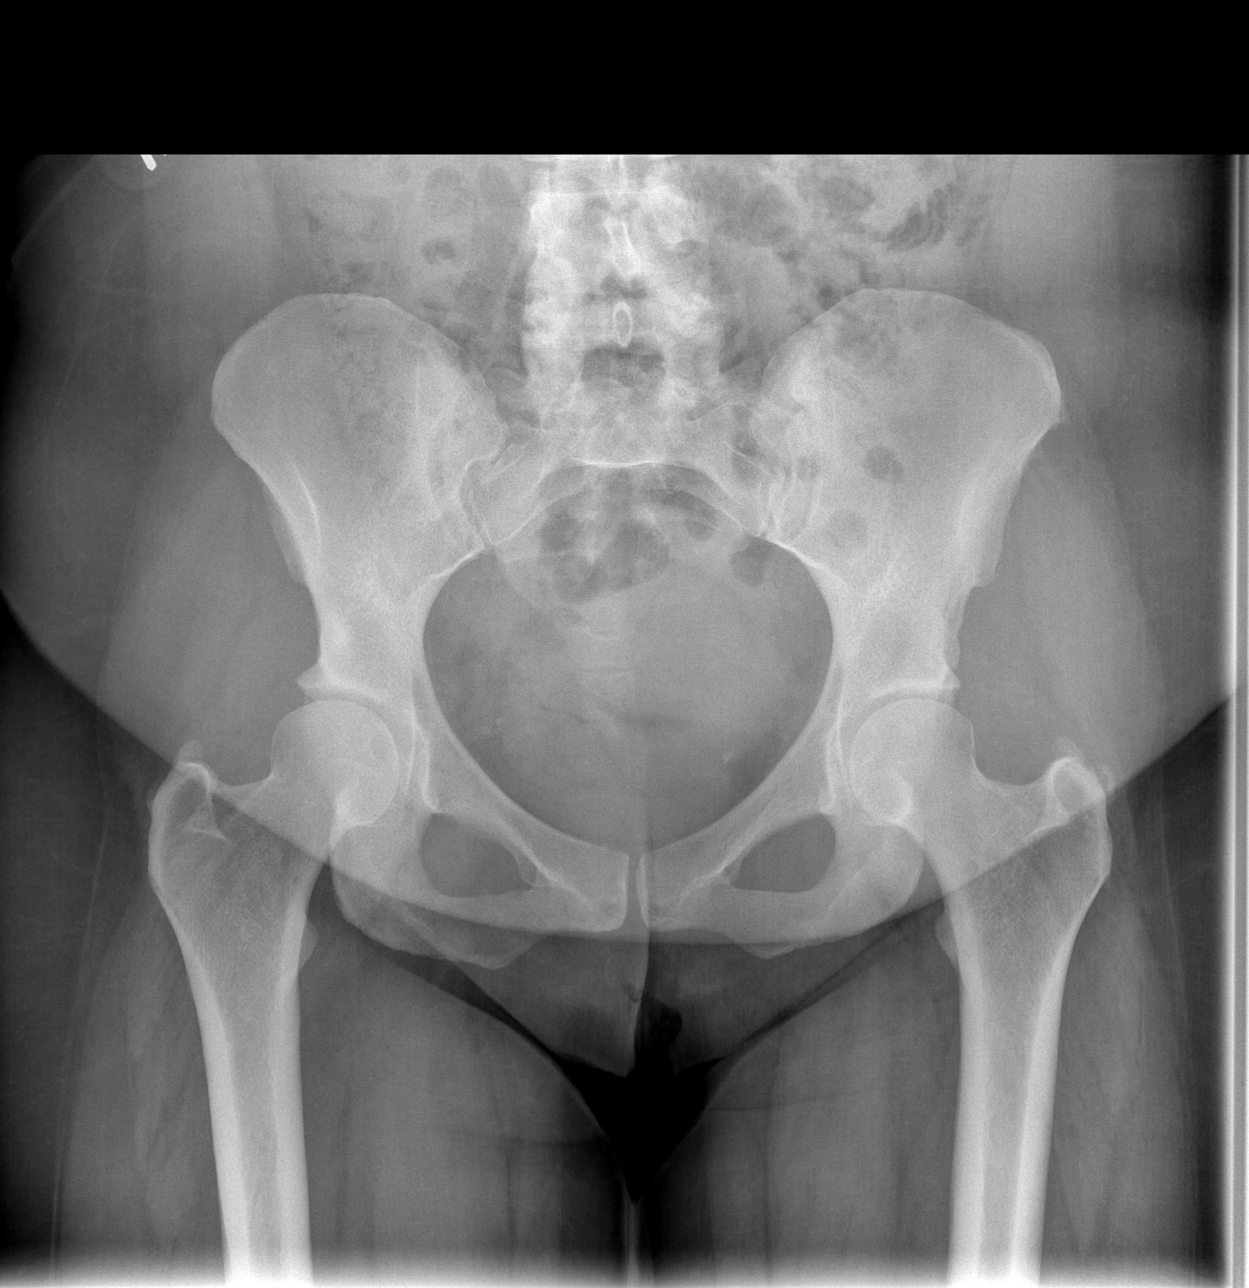

[t hip ap left]
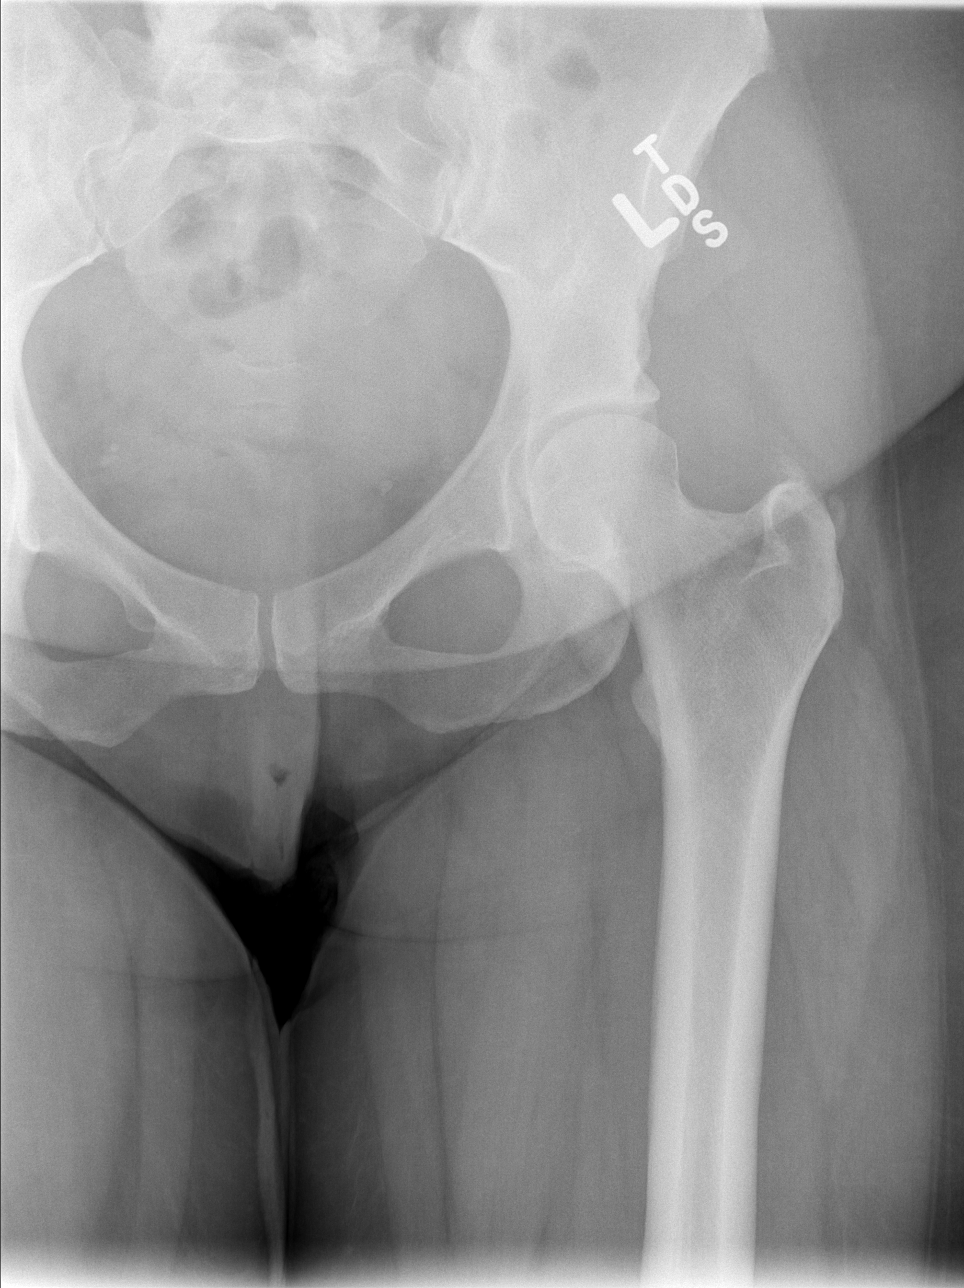

[t hip frog leg left]
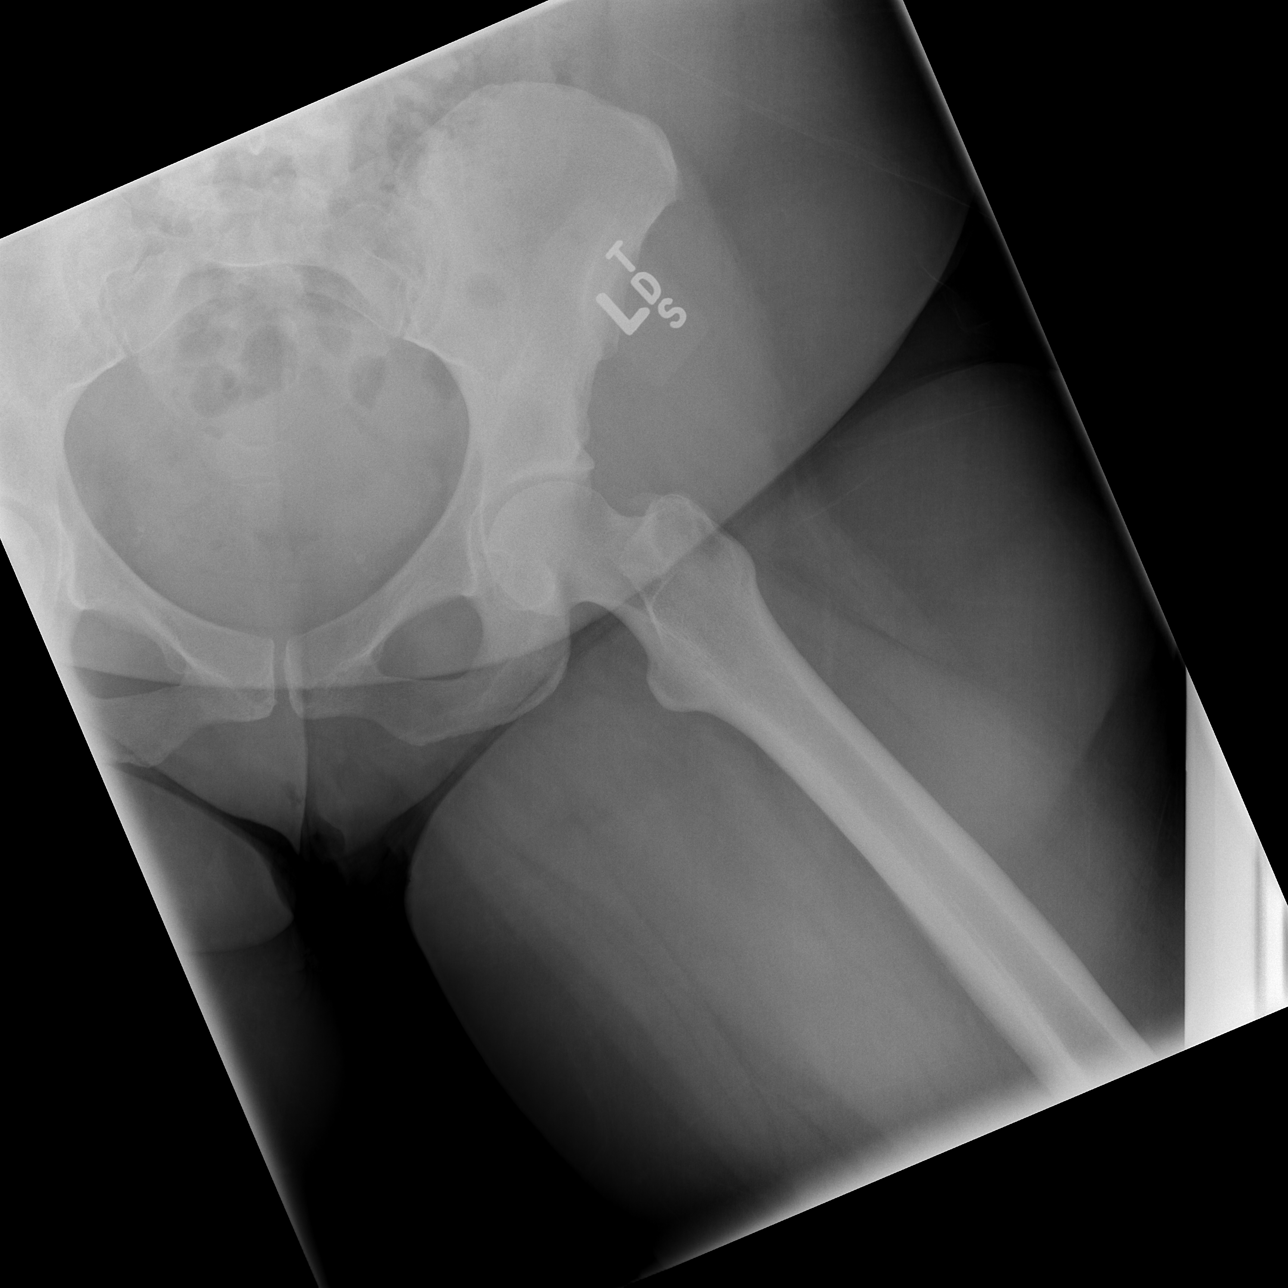

[3 of 3 positions shown; findings below may reference images not displayed]

FINDINGS: The pelvic ring is intact. No acute fracture or dislocation is seen.
No soft tissue changes are noted.
IMPRESSION: No acute abnormality noted.

## 2015-01-23 ENCOUNTER — Encounter (HOSPITAL_COMMUNITY): Payer: Self-pay | Admitting: Neurology

## 2015-01-23 ENCOUNTER — Emergency Department (HOSPITAL_COMMUNITY)
Admission: EM | Admit: 2015-01-23 | Discharge: 2015-01-23 | Disposition: A | Discharge status: Home / Self Care | Payer: Self-pay | Attending: Emergency Medicine | Admitting: Emergency Medicine

## 2015-01-23 DIAGNOSIS — Y92410 Unspecified street and highway as the place of occurrence of the external cause: Secondary | ICD-10-CM | POA: Insufficient documentation

## 2015-01-23 DIAGNOSIS — W208XXA Other cause of strike by thrown, projected or falling object, initial encounter: Secondary | ICD-10-CM | POA: Insufficient documentation

## 2015-01-23 DIAGNOSIS — Z79899 Other long term (current) drug therapy: Secondary | ICD-10-CM | POA: Insufficient documentation

## 2015-01-23 DIAGNOSIS — F419 Anxiety disorder, unspecified: Secondary | ICD-10-CM | POA: Insufficient documentation

## 2015-01-23 DIAGNOSIS — S3992XA Unspecified injury of lower back, initial encounter: Secondary | ICD-10-CM | POA: Insufficient documentation

## 2015-01-23 DIAGNOSIS — M1990 Unspecified osteoarthritis, unspecified site: Secondary | ICD-10-CM | POA: Insufficient documentation

## 2015-01-23 DIAGNOSIS — M545 Low back pain, unspecified: Secondary | ICD-10-CM

## 2015-01-23 DIAGNOSIS — Y998 Other external cause status: Secondary | ICD-10-CM | POA: Insufficient documentation

## 2015-01-23 DIAGNOSIS — Z8709 Personal history of other diseases of the respiratory system: Secondary | ICD-10-CM | POA: Insufficient documentation

## 2015-01-23 DIAGNOSIS — F17210 Nicotine dependence, cigarettes, uncomplicated: Secondary | ICD-10-CM | POA: Insufficient documentation

## 2015-01-23 DIAGNOSIS — Y9389 Activity, other specified: Secondary | ICD-10-CM | POA: Insufficient documentation

## 2015-01-23 MED ORDER — CYCLOBENZAPRINE HCL 10 MG PO TABS
5.0000 mg | ORAL_TABLET | Freq: Once | ORAL | Status: AC
  Administered 2015-01-23: 5 mg via ORAL
  Filled 2015-01-23: qty 1

## 2015-01-23 MED ORDER — OXYCODONE-ACETAMINOPHEN 5-325 MG PO TABS
1.0000 | ORAL_TABLET | Freq: Once | ORAL | Status: AC
  Administered 2015-01-23: 1 via ORAL
  Filled 2015-01-23: qty 1

## 2015-01-23 MED ORDER — OXYCODONE-ACETAMINOPHEN 5-325 MG PO TABS: 2.0000 | ORAL_TABLET | ORAL | Status: DC | PRN

## 2015-01-23 MED ORDER — NAPROXEN 250 MG PO TABS
500.0000 mg | ORAL_TABLET | Freq: Once | ORAL | Status: AC
Start: 2015-01-23 — End: 2015-01-23
  Administered 2015-01-23: 500 mg via ORAL
  Filled 2015-01-23: qty 2

## 2015-01-23 MED ORDER — METHOCARBAMOL 500 MG PO TABS: 500.0000 mg | ORAL_TABLET | Freq: Two times a day (BID) | ORAL | Status: DC

## 2015-01-23 MED ORDER — NAPROXEN 500 MG PO TABS: 500.0000 mg | ORAL_TABLET | Freq: Two times a day (BID) | ORAL | Status: DC
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
## 2015-01-23 NOTE — ED Notes (Signed)
Declined W/C at D/C and was escorted to lobby by RN. 

## 2015-05-13 IMAGING — CR DG KNEE COMPLETE 4+V*R*
4 series · 4 of 4 positions shown · non-contrast
Comparison: RIGHT knee radiograph January 08, 2011

CLINICAL DATA: RIGHT knee pain, no known injury.

EXAM:
RIGHT KNEE - COMPLETE 4+ VIEW

[knee ap]
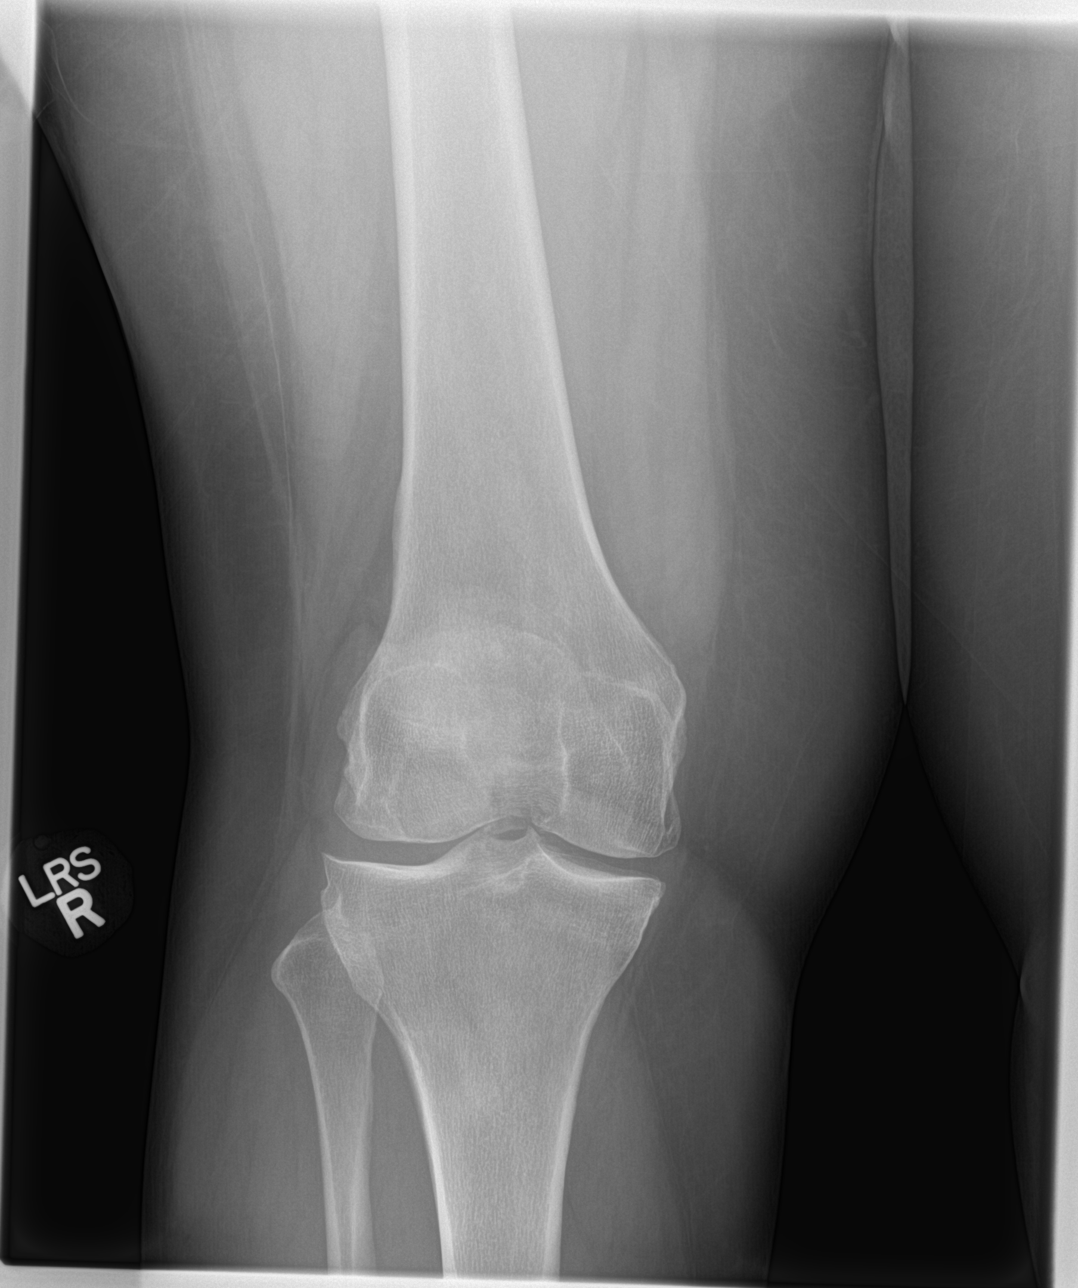

[knee obl (1 of 2)]
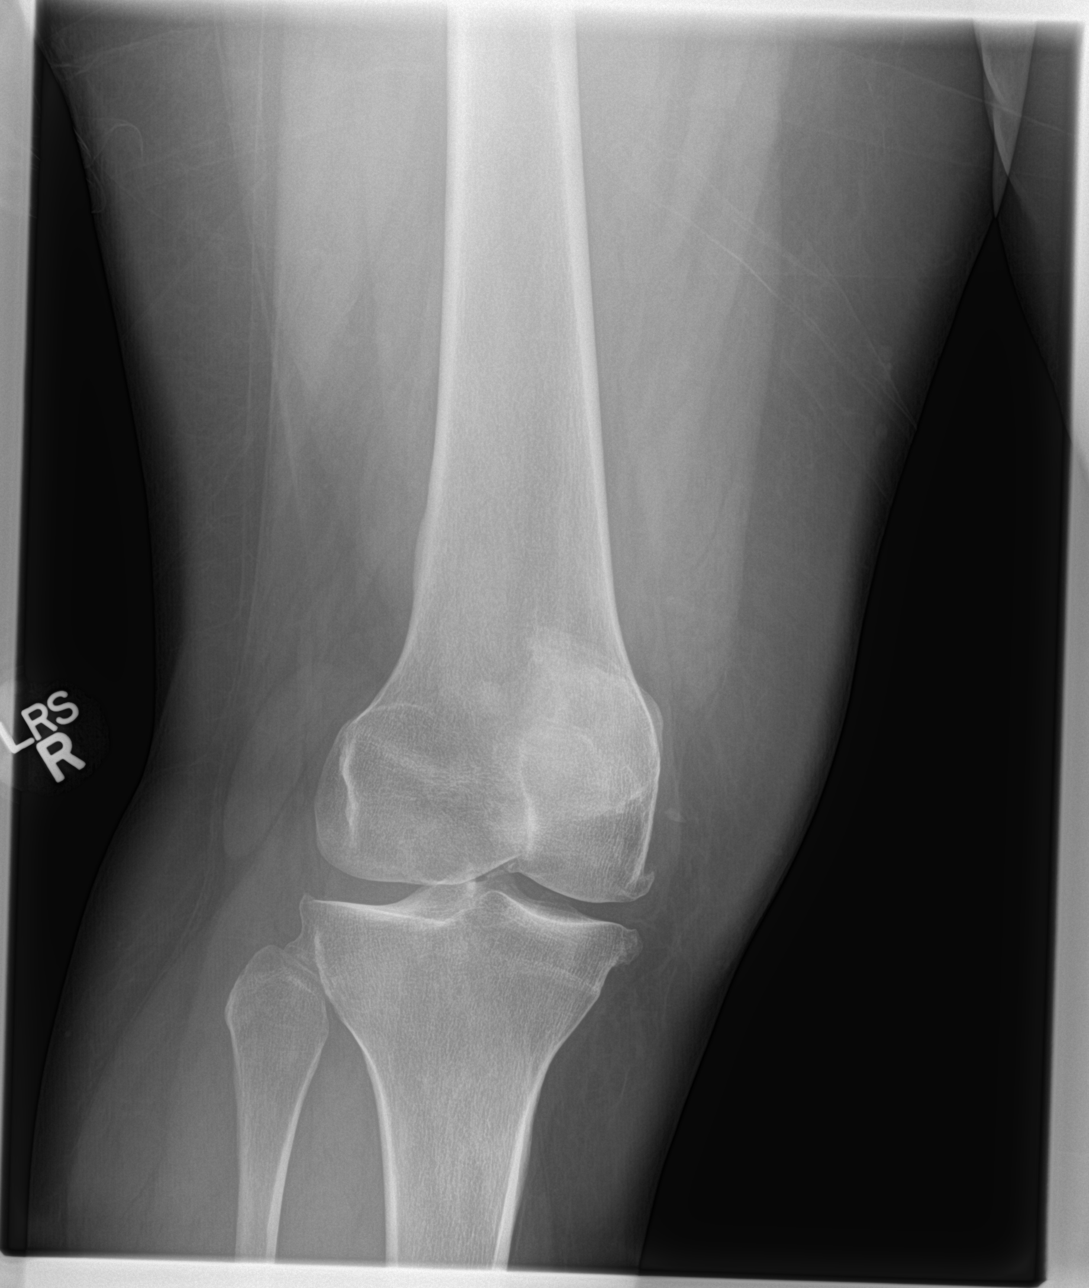

[knee obl (2 of 2)]
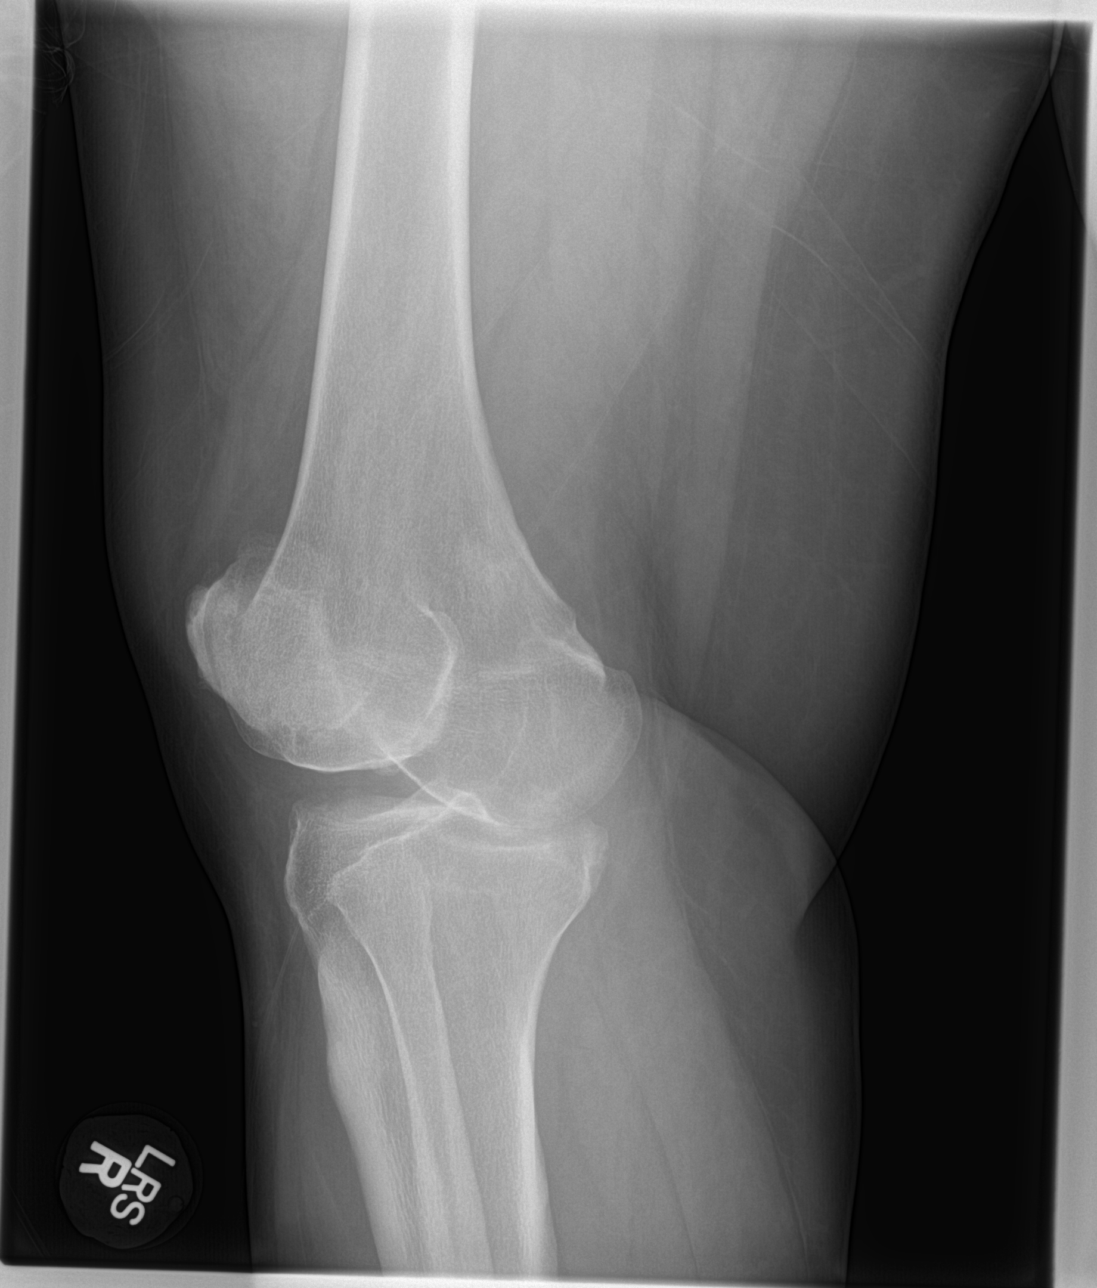

[knee lat]
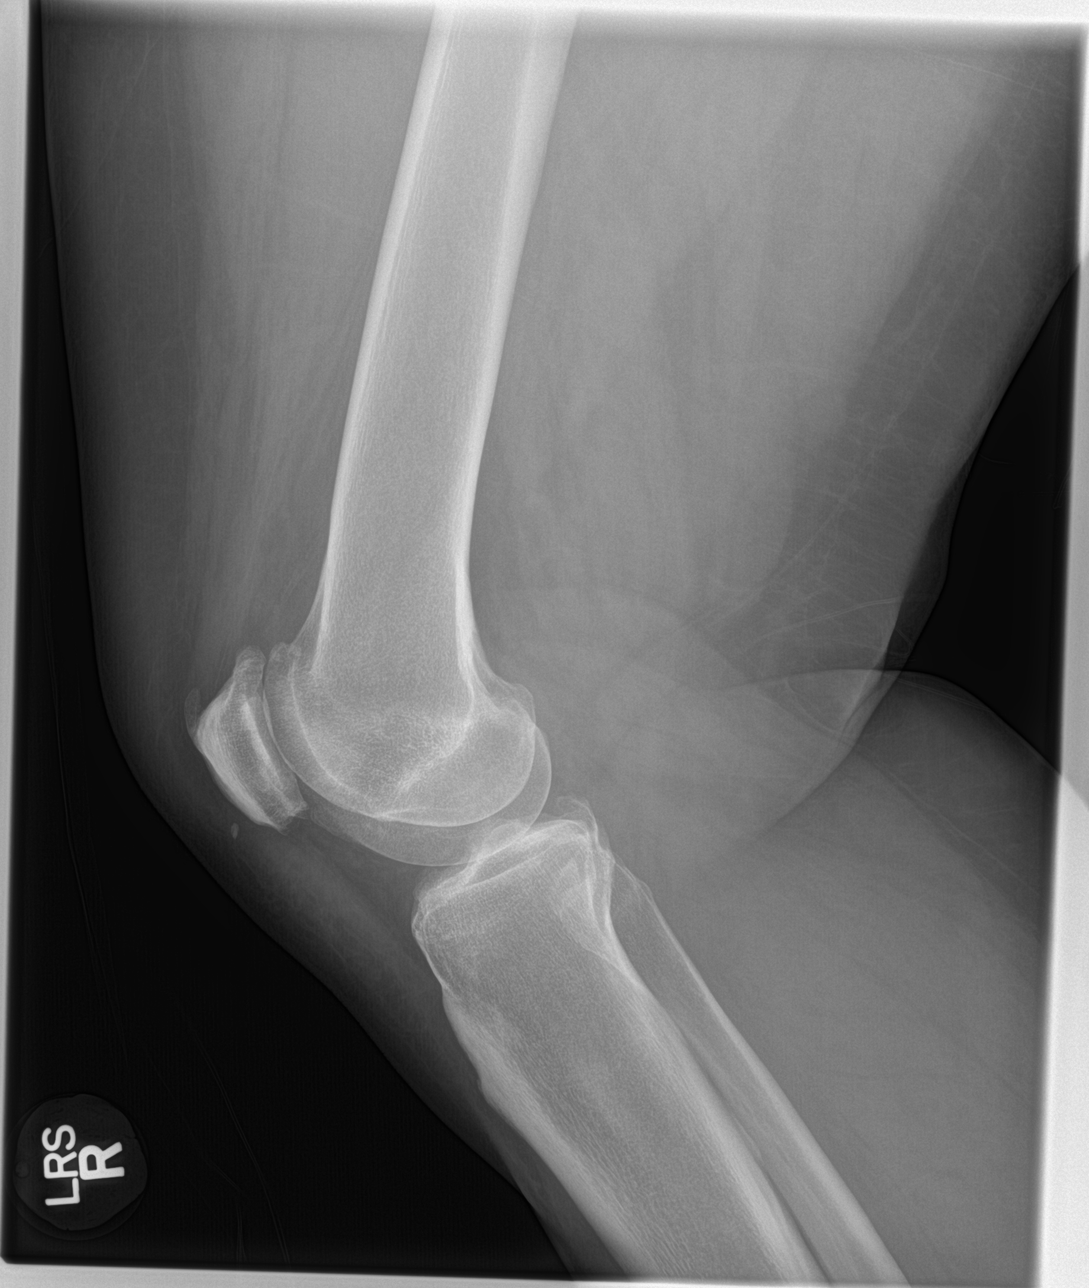

[4 of 4 positions shown; findings below may reference images not displayed]

FINDINGS: No acute fracture deformity, no dislocation. Moderate
tricompartmental osteoarthrosis. No destructive bony lesions. Soft
tissue planes are nonsuspicious.
IMPRESSION: No acute fracture deformity or dislocation.

Similar moderate tricompartmental osteoarthrosis.

  By: Blain Jumper

## 2015-08-07 ENCOUNTER — Encounter (HOSPITAL_COMMUNITY): Payer: Self-pay | Admitting: Emergency Medicine

## 2015-08-07 ENCOUNTER — Emergency Department (HOSPITAL_COMMUNITY)
Admission: EM | Admit: 2015-08-07 | Discharge: 2015-08-07 | Disposition: A | Discharge status: Home / Self Care | Payer: Self-pay | Attending: Emergency Medicine | Admitting: Emergency Medicine

## 2015-08-07 ENCOUNTER — Emergency Department (HOSPITAL_COMMUNITY): Payer: Self-pay

## 2015-08-07 DIAGNOSIS — F17210 Nicotine dependence, cigarettes, uncomplicated: Secondary | ICD-10-CM | POA: Insufficient documentation

## 2015-08-07 DIAGNOSIS — M545 Low back pain: Secondary | ICD-10-CM | POA: Insufficient documentation

## 2015-08-07 DIAGNOSIS — Z79899 Other long term (current) drug therapy: Secondary | ICD-10-CM | POA: Insufficient documentation

## 2015-08-07 LAB — URINE MICROSCOPIC-ADD ON: Bacteria, UA: NONE SEEN

## 2015-08-07 LAB — URINALYSIS, ROUTINE W REFLEX MICROSCOPIC
BILIRUBIN URINE: NEGATIVE
GLUCOSE, UA: NEGATIVE mg/dL
Ketones, ur: NEGATIVE mg/dL
Nitrite: NEGATIVE
PROTEIN: NEGATIVE mg/dL
Specific Gravity, Urine: 1.022 (ref 1.005–1.030)
pH: 7.5 (ref 5.0–8.0)

## 2015-08-07 MED ORDER — METHOCARBAMOL 500 MG PO TABS
500.0000 mg | ORAL_TABLET | Freq: Once | ORAL | Status: AC
  Administered 2015-08-07: 500 mg via ORAL
  Filled 2015-08-07: qty 1

## 2015-08-07 MED ORDER — OXYCODONE-ACETAMINOPHEN 5-325 MG PO TABS: 1.0000 | ORAL_TABLET | Freq: Three times a day (TID) | ORAL | Status: DC | PRN

## 2015-08-07 MED ORDER — NAPROXEN 500 MG PO TABS
500.0000 mg | ORAL_TABLET | Freq: Two times a day (BID) | ORAL | Status: DC
Start: 2015-08-07 — End: 2015-10-03

## 2015-08-07 MED ORDER — METHOCARBAMOL 500 MG PO TABS: 500.0000 mg | ORAL_TABLET | Freq: Two times a day (BID) | ORAL | Status: DC

## 2015-08-07 MED ORDER — OXYCODONE-ACETAMINOPHEN 5-325 MG PO TABS
1.0000 | ORAL_TABLET | Freq: Once | ORAL | Status: AC
  Administered 2015-08-07: 1 via ORAL
  Filled 2015-08-07: qty 1

## 2015-08-07 NOTE — ED Notes (Signed)
Patient states chronic back pain that has worsened since going back to work about a week ago.   Patient denies injury of any kind.   Patient states no urinary symptoms, but sometimes she has a strong smell.   Patient denies other symptoms.

## 2015-08-07 NOTE — ED Notes (Signed)
Patient able to ambulate independently  

## 2015-08-07 NOTE — ED Provider Notes (Signed)
CSN: [id number]     Arrival date & time 08/07/15  1128 History   First MD Initiated Contact with Patient 08/07/15 1607     Chief Complaint  Patient presents with  . Back Pain    (Consider location/radiation/quality/duration/timing/severity/associated sxs/prior Treatment) Patient is a 45 y.o. female presenting with back pain. The history is provided by the patient and medical records. No language interpreter was used.  Back Pain Associated symptoms: numbness (Left leg - chronic)   Associated symptoms: no abdominal pain, no dysuria, no fever and no headaches     Susan Wang is a 45 y.o. female  with a PMH of DDD, OA who presents to the Emergency Department complaining of acute on chronic central low back pain described as a "pressure, heavy pain" x 4-5 days which intermittently radiates down left lower extremity. Patient states characterization of pain today is consistent with her usual chronic low back pain and she has no new symptoms, however today her symptoms are more severe and have lasted longer in duration. Typically she takes a few Motrin and symptoms resolved with rest in 1-2 days. Current episode has been present for to 5 days. Motrin with little relief. Patient states while she is not working, her back pain is under control. She returned to work last week and pain began a day or two after her return to work. 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 + left sided numbness which has been present several times before in her back pain flares. She denies upper back or neck pain, no fever. No history of cancer, IVDU, or recent spinal procedures. No saddle anesthesia. No urinary complaints including no retention or incontinence but states she has had a foul odor to urine recently. Denies vaginal discharge. In monogamous relationship with husband and states she has no concern for STDs.  Past Medical History  Diagnosis Date  . Arthritis   . DJD  (degenerative joint disease)   . Osteoarthritis     knees  . Bronchitis   . Plantar fasciitis   . Chest pain 09/2012  . Anxiety   . Shortness of breath   . Carpal tunnel syndrome   . DDD (degenerative disc disease)   . Carpal tunnel syndrome    Past Surgical History  Procedure Laterality Date  . Cholecystectomy     Family History  Problem Relation Age of Onset  . Diabetes Father   . Other Mother   . Healthy Sister   . Healthy Brother    Social History  Substance Use Topics  . Smoking status: Current Every Day Smoker -- 1.00 packs/day for 13 years    Types: Cigars, Cigarettes    Last Attempt to Quit: 05/14/2010  . Smokeless tobacco: Never Used  . Alcohol Use: No     Comment: occasionally   OB History    No data available     Review of Systems  Constitutional: Negative for fever and chills.  HENT: Negative for congestion.   Eyes: Negative for visual disturbance.  Respiratory: Negative for cough and shortness of breath.   Cardiovascular: Negative.   Gastrointestinal: Negative for nausea, vomiting and abdominal pain.  Genitourinary: Negative for dysuria, urgency and vaginal discharge.  Musculoskeletal: Positive for back pain. Negative for neck pain and neck stiffness.  Skin: Negative for rash.  Neurological: Positive for numbness (Left leg - chronic). Negative for headaches.      Allergies  Other  Home Medications   Prior  to Admission medications   Medication Sig Start Date End Date Taking? Authorizing Provider  albuterol (VENTOLIN HFA) 108 (90 Base) MCG/ACT inhaler Inhale 2 puffs into the lungs every 6 (six) hours as needed for wheezing or shortness of breath.   Yes Historical Provider, MD  DiphenhydrAMINE HCl (ALKA-SELTZER PLUS ALLERGY PO) Take 2-4 tablets by mouth 2 (two) times daily as needed (for symptoms).   Yes Historical Provider, MD  colchicine 0.6 MG tablet Take 1 tablet (0.6 mg total) by mouth daily. Patient not taking: Reported on 08/07/2015 06/10/14    Junius FinnerErin O'Malley, PA-C  dicyclomine (BENTYL) 20 MG tablet Take 1 tablet (20 mg total) by mouth 2 (two) times daily as needed for spasms (abdominal cramping). Patient not taking: Reported on 08/07/2015 09/10/14   Trixie DredgeEmily West, PA-C  methocarbamol (ROBAXIN) 500 MG tablet Take 1 tablet (500 mg total) by mouth 2 (two) times daily. 08/07/15   Chase PicketJaime Pilcher Kipp Shank, PA-C  naproxen (NAPROSYN) 500 MG tablet Take 1 tablet (500 mg total) by mouth 2 (two) times daily. 08/07/15   Chase PicketJaime Pilcher Yona Stansbury, PA-C  oxyCODONE-acetaminophen (PERCOCET/ROXICET) 5-325 MG tablet Take 1 tablet by mouth every 8 (eight) hours as needed for severe pain. 08/07/15   Chase PicketJaime Pilcher Andreya Lacks, PA-C  polyethylene glycol [facility](MIRALAX / GLYCOLAX) packet Take 17 g by mouth daily as needed (constipation). Patient not taking: Reported on 08/07/2015 09/10/14   Trixie DredgeEmily West, PA-C   BP 150/88 mmHg  Pulse 60  Temp(Src) 98 F (36.7 C) (Oral)  Resp 18  Ht 5' (1.524 m)  Wt 97.098 kg  BMI 41.81 kg/m2  SpO2 99%  LMP 08/06/2015 Physical Exam  Constitutional: She is oriented to person, place, and time. She appears well-developed and well-nourished.  Appears in pain but NAD  Neck:  Full ROM without pain No midline tenderness No tenderness of paraspinal musculature  Cardiovascular: Normal rate, regular rhythm and normal heart sounds.  Exam reveals no gallop and no friction rub.   No murmur heard. 2+ distal pulses.   Pulmonary/Chest: Effort normal and breath sounds normal. No respiratory distress. She has no wheezes. She has no rales.  Abdominal: Soft. Bowel sounds are normal. She exhibits no distension. There is no tenderness.  Musculoskeletal:       Arms: Patient is able to ambulate without difficulty. TTP as depicted in image. Full ROM.  No noted deformities or signs of inflammation. No overlying skin changes. Curvature of cervical, thoracic, and lumbar spine within normal limits. Straight leg raises are negative bilaterally for radicular symptoms but does  exacerbate low back pain. 5/5 muscle strength of bilateral LE's   Neurological: She is alert and oriented to person, place, and time. She has normal reflexes.  Subjective decreased sensation to LLE as compared to RLE.   Skin: Skin is warm and dry. No rash noted. No erythema.  Nursing note and vitals reviewed.   ED Course  Procedures (including critical care time) Labs Review Labs Reviewed  URINALYSIS, ROUTINE W REFLEX MICROSCOPIC (NOT AT [facility]) - Abnormal; Notable for the following:    Hgb urine dipstick SMALL (*)    Leukocytes, UA SMALL (*)    All other components within normal limits  URINE MICROSCOPIC-ADD ON - Abnormal; Notable for the following:    Squamous Epithelial / LPF 0-5 (*)    All other components within normal limits  URINE CULTURE    Imaging Review Dg Lumbar Spine Complete  08/07/2015  CLINICAL DATA:  Central lumbar pain, lateral LEFT hip pain for 1 week,  no known injury, initial encounter EXAM: LUMBAR SPINE - COMPLETE 4+ VIEW COMPARISON:  None FINDINGS: Five non-rib-bearing lumbar vertebra. Mild facet degenerative changes lower lumbar spine. Vertebral body heights maintained. Disc space narrowing L4-L5. No acute fracture, subluxation, or bone destruction. No spondylolysis. SI joints symmetric. IMPRESSION: Mild degenerative disc and facet disease changes at lower lumbar spine. No acute abnormalities. Electronically Signed   By: Ulyses Southward M.D.   On: 08/07/2015 17:21   Dg Hip Unilat With Pelvis 2-3 Views Left  08/07/2015  CLINICAL DATA:  Central lumbar pain, lateral LEFT hip pain for 1 week, no known injury, initial encounter EXAM: DG HIP (WITH OR WITHOUT PELVIS) 2-3V LEFT COMPARISON:  None FINDINGS: Hip and SI joint spaces symmetric and preserved. Osseous mineralization grossly normal. No acute fracture, subluxation, or bone destruction. Small BILATERAL pelvic phleboliths. IMPRESSION: No acute osseous abnormalities. Electronically Signed   By: Ulyses Southward M.D.   On: 08/07/2015  17:22   I have personally reviewed and evaluated these images and lab results as part of my medical decision-making.   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 Patient believes she "overdid it at work". Patient demonstrates no lower extremity weakness, saddle anesthesia, bowel or bladder incontinence. She does have subjective decreased sensation of LLE but otherwise NVI. X-ray of L-spine with mild degenerative disc and facet changes but no acute abnormalities. Hip films with no acute abnormalities.  No concern for cauda equina. No fevers or other infectious symptoms to suggest that the patient's back pain is due to an infection. Patient given robaxin and percocet in ED for pain relief.   Patient re-evaluated and pain much improved. She is ambulating without difficulty. UA reviewed small leuks, 6-30 WBC's, 0-5 squamous with no bacteria seen. Patient denies dysuria, urgency, frequency and no CVA tenderness on exam. Given no urinary symptoms, will send urine for cx and treat only if cx positive. Patient agreeable to this plan. I have reviewed return precautions, including the development of red flag symptoms or new/worsening symptoms and the patient has voiced understanding. I reviewed supportive care instructions, including NSAIDs. Consult to [facility] controlled substance database, patient with no narcotic prescriptions filled. Will discharge home with Rx for Robaxin and Percocet. PCP follow-up strongly encouraged. [facility] contact information given and patient informed of the number on discharge summary to call for help finding a primary physician. BP slightly elevated while in ED. She reports no chest pain or shortness of breath. No cardiac history. Again stressed  importance of PCP follow-up for BP recheck. Patient voiced understanding and agreement with plan.   [facility] Charlisa Cham, PA-C 08/07/15 1846  Loren Racer, MD 08/07/15 [phone]

## 2015-08-07 NOTE — ED Notes (Signed)
PA at bedside.

## 2015-08-07 NOTE — ED Notes (Signed)
Pt ambulated independently from pt's room to the bathroom.

## 2015-08-07 NOTE — Discharge Instructions (Signed)
Naproxen as needed for mild to moderate pain. Use pain medication only as needed for severe pain. Robaxin is your muscle relaxer. The pain medication and muscle relaxer can make you very drowsy - please do not drink alcohol, operate heavy machinery or drive on these medications. It is very important that you schedule an appointment with a primary care provider as discussed. You can call the clinic listed and schedule appointment or utilize the phone number listed on your discharge instructions for help finding a primary physician.  Return to ER for fever, inability to walk, new or worsening symptoms, any additional concerns.   Back Pain:  Your back pain should be treated with medicines such as ibuprofen or aleve and this back pain should get better over the next 2 weeks.  However if you develop severe or worsening pain, low back pain with fever, numbness, weakness or inability to walk or urinate, you should return to the ER immediately.  Please follow up with your doctor this week for a recheck if still having symptoms.  Low back pain is discomfort in the lower back that may be due to injuries to muscles and ligaments around the spine. Occasionally, it may be caused by a a problem to a part of the spine called a disc. The pain may last several days or a week;  However, most patients get completely well in 4 weeks.  COLD THERAPY DIRECTIONS:  Ice or gel packs can be used to reduce both pain and swelling. Ice is the most helpful within the first 24 to 48 hours after an injury or flareup from overusing a muscle or joint.  Ice is effective, has very few side effects, and is safe for most people to use.   If you expose your skin to cold temperatures for too long or without the proper protection, you can damage your skin or nerves. Watch for signs of skin damage due to cold.   HOME CARE INSTRUCTIONS  Follow these tips to use ice and cold packs safely.  Place a dry or damp towel between the ice and skin. A  damp towel will cool the skin more quickly, so you may need to shorten the time that the ice is used.  For a more rapid response, add gentle compression to the ice.  Ice for no more than 10 to 20 minutes at a time. The bonier the area you are icing, the less time it will take to get the benefits of ice.  Check your skin after 5 minutes to make sure there are no signs of a poor response to cold or skin damage.  Rest 20 minutes or more in between uses.  Once your skin is numb, you can end your treatment. You can test numbness by very lightly touching your skin. The touch should be so light that you do not see the skin dimple from the pressure of your fingertip. When using ice, most people will feel these normal sensations in this order: cold, burning, aching, and numbness.   You will need to follow up with  Your primary healthcare provider in 1-2 weeks for reassessment.  Be aware that if you develop new symptoms, such as a fever, leg weakness, difficulty with or loss of control of your urine or bowels, abdominal pain, or more severe pain, you will need to seek medical attention and  / or return to the Emergency department

## 2015-08-07 NOTE — ED Notes (Addendum)
Pt able to ambulate independently in room.  Gait steady and even

## 2015-08-21 ENCOUNTER — Ambulatory Visit: Payer: Self-pay | Attending: Internal Medicine

## 2015-09-22 ENCOUNTER — Encounter (HOSPITAL_COMMUNITY): Payer: Self-pay | Admitting: Emergency Medicine

## 2015-09-22 ENCOUNTER — Emergency Department (HOSPITAL_COMMUNITY): Payer: Self-pay

## 2015-09-22 ENCOUNTER — Emergency Department (HOSPITAL_COMMUNITY)
Admission: EM | Admit: 2015-09-22 | Discharge: 2015-09-22 | Disposition: A | Discharge status: Home / Self Care | Payer: Self-pay | Attending: Emergency Medicine | Admitting: Emergency Medicine

## 2015-09-22 DIAGNOSIS — Y939 Activity, unspecified: Secondary | ICD-10-CM | POA: Insufficient documentation

## 2015-09-22 DIAGNOSIS — S9031XA Contusion of right foot, initial encounter: Secondary | ICD-10-CM | POA: Insufficient documentation

## 2015-09-22 DIAGNOSIS — W208XXA Other cause of strike by thrown, projected or falling object, initial encounter: Secondary | ICD-10-CM | POA: Insufficient documentation

## 2015-09-22 DIAGNOSIS — Y929 Unspecified place or not applicable: Secondary | ICD-10-CM | POA: Insufficient documentation

## 2015-09-22 DIAGNOSIS — Y999 Unspecified external cause status: Secondary | ICD-10-CM | POA: Insufficient documentation

## 2015-09-22 DIAGNOSIS — F17210 Nicotine dependence, cigarettes, uncomplicated: Secondary | ICD-10-CM | POA: Insufficient documentation

## 2015-09-22 MED ORDER — OXYCODONE-ACETAMINOPHEN 5-325 MG PO TABS
ORAL_TABLET | ORAL | Status: AC
  Filled 2015-09-22: qty 1

## 2015-09-22 MED ORDER — OXYCODONE-ACETAMINOPHEN 5-325 MG PO TABS: 1.0000 | ORAL_TABLET | Freq: Once | ORAL | Status: DC

## 2015-09-22 MED ORDER — OXYCODONE-ACETAMINOPHEN 5-325 MG PO TABS
1.0000 | ORAL_TABLET | ORAL | Status: DC | PRN
  Administered 2015-09-22: 1 via ORAL

## 2015-09-22 MED ORDER — OXYCODONE-ACETAMINOPHEN 5-325 MG PO TABS: 1.0000 | ORAL_TABLET | Freq: Three times a day (TID) | ORAL | 0 refills | Status: DC | PRN

## 2015-09-22 MED ORDER — IBUPROFEN 800 MG PO TABS: 800.0000 mg | ORAL_TABLET | Freq: Three times a day (TID) | ORAL | 0 refills | Status: DC

## 2015-09-22 NOTE — ED Triage Notes (Signed)
Dropped an iron on right foot, top of foot swollen, c/o pain

## 2015-09-22 NOTE — ED Provider Notes (Signed)
[facility]-EMERGENCY DEPT Provider Note   CSN: [id number] Arrival date & time: 09/22/15  1331  First Provider Contact:  First MD Initiated Contact with Patient 09/22/15 1507     By signing my name below, I, Linna Darner, attest that this documentation has been prepared under the direction and in the presence of Danelle Berry, PA-C. Electronically Signed: Linna Darner, Scribe. 09/22/2015. 4:30 PM.   History   Chief Complaint Chief Complaint  Patient presents with  . Foot Pain    The history is provided by the patient. No language interpreter was used.     HPI Comments: Susan Wang is a 45 y.o. female with PMHx of arthritis, osteoarthritis, and plantar fasciitis who presents to the Emergency Department complaining of sudden onset, constant, severe, 8/10, dorsal right foot pain s/p dropping an iron on her right foot about 3 hours ago. She notes associated right foot swelling. She states she felt something pop in her right foot when the incident occurred. She notes she does not have a burn injury from the iron. Pt endorses severe pain exacerbation with palpation to her dorsal right foot, bearing weight on her right foot, and general movement of her right foot. Pt was given a percocet in the ER with mild relief of her pain. She denies numbness, neuro deficits, or any other associated symptoms.  Past Medical History:  Diagnosis Date  . Anxiety   . Arthritis   . Bronchitis   . Carpal tunnel syndrome   . Carpal tunnel syndrome   . Chest pain 09/2012  . DDD (degenerative disc disease)   . DJD (degenerative joint disease)   . Osteoarthritis    knees  . Plantar fasciitis   . Shortness of breath     Patient Active Problem List   Diagnosis Date Noted  . 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 Plantar fasciitis of left foot 11/01/2012  . Carpal tunnel syndrome, bilateral 11/01/2012  . Preventative health care 11/01/2012  . Palpitations 10/19/2012  . GERD (gastroesophageal reflux  disease) 10/14/2012  . DJD (degenerative joint disease)   . Osteoarthritis     Past Surgical History:  Procedure Laterality Date  . CHOLECYSTECTOMY      OB History    No data available       Home Medications    Prior to Admission medications   Medication Sig Start Date End Date Taking? Authorizing Provider  albuterol (VENTOLIN HFA) 108 (90 Base) MCG/ACT inhaler Inhale 2 puffs into the lungs every 6 (six) hours as needed for wheezing or shortness of breath.    Historical Provider, MD  colchicine 0.6 MG tablet Take 1 tablet (0.6 mg total) by mouth daily. Patient not taking: Reported on 08/07/2015 06/10/14   Junius Finner, PA-C  dicyclomine (BENTYL) 20 MG tablet Take 1 tablet (20 mg total) by mouth 2 (two) times daily as needed for spasms (abdominal cramping). Patient not taking: Reported on 08/07/2015 09/10/14   Trixie Dredge, PA-C  DiphenhydrAMINE HCl (ALKA-SELTZER PLUS ALLERGY PO) Take 2-4 tablets by mouth 2 (two) times daily as needed (for symptoms).    Historical Provider, MD  ibuprofen (ADVIL,MOTRIN) 800 MG tablet Take 1 tablet (800 mg total) by mouth 3 (three) times daily. 09/22/15   Danelle Berry, PA-C  methocarbamol (ROBAXIN) 500 MG tablet Take 1 tablet (500 mg total) by mouth 2 (two) times daily. 08/07/15   Chase Picket Ward, PA-C  naproxen (NAPROSYN) 500 MG tablet Take 1 tablet (500 mg total) by mouth 2 (two)  times daily. 08/07/15   Chase Picket Ward, PA-C  oxyCODONE-acetaminophen (PERCOCET/ROXICET) 5-325 MG tablet Take 1-2 tablets by mouth every 8 (eight) hours as needed for severe pain. 09/22/15   Danelle Berry, PA-C  polyethylene glycol (MIRALAX / GLYCOLAX) packet Take 17 g by mouth daily as needed (constipation). Patient not taking: Reported on 08/07/2015 09/10/14   Trixie Dredge, PA-C    Family History Family History  Problem Relation Age of Onset  . Diabetes Father   . Other Mother   . Healthy Sister   . Healthy Brother     Social History Social History  Substance Use Topics    . Smoking status: Current Every Day Smoker    Packs/day: 1.00    Years: 13.00    Types: Cigars, Cigarettes    Last attempt to quit: 05/14/2010  . Smokeless tobacco: Never Used  . Alcohol use No     Comment: occasionally     Allergies   Other   Review of Systems Review of Systems  All other systems reviewed and are negative.  Physical Exam Updated Vital Signs BP 124/79 (BP Location: Right Arm)   Pulse (!) 56   Temp 98.7 F (37.1 C) (Oral)   Resp 18   SpO2 99%   Physical Exam  Constitutional: She is oriented to person, place, and time. She appears well-developed and well-nourished. No distress.  HENT:  Head: Normocephalic and atraumatic.  Eyes: Conjunctivae and EOM are normal.  Neck: Neck supple. No tracheal deviation present.  Cardiovascular: Normal rate.   Pulmonary/Chest: Effort normal. No respiratory distress.  Musculoskeletal: Normal range of motion.  Right dorsum of foot over 3rd, 4th, and 5th metatarsals: soft tissue swelling with mild ecchymosis and tenderness to light palpation. Normal sensation of all digits. Normal capillary refill.  Neurological: She is alert and oriented to person, place, and time.  Skin: Skin is warm and dry.  Psychiatric: She has a normal mood and affect. Her behavior is normal.  Nursing note and vitals reviewed.   ED Treatments / Results  Labs (all labs ordered are listed, but only abnormal results are displayed) Labs Reviewed - No data to display  EKG  EKG Interpretation None       Radiology   Dg Foot Complete Right  Result Date: 09/22/2015 CLINICAL DATA:  Dorsal right foot pain. Dropped an iron on foot today. EXAM: RIGHT FOOT COMPLETE - 3+ VIEW COMPARISON:  06/10/2014 FINDINGS: Soft tissue swelling along the dorsum of the foot. No acute bony abnormality. Specifically, no fracture, subluxation, or dislocation. Soft tissues are intact. IMPRESSION: No acute bony abnormality. Electronically Signed   By: Charlett Nose M.D.   On:  09/22/2015 14:30     Procedures Procedures (including critical care time)  DIAGNOSTIC STUDIES: Oxygen Saturation is 99% on RA, normal by my interpretation.    COORDINATION OF CARE: 4:30 PM Discussed treatment plan with pt at bedside and pt agreed to plan.   Medications Ordered in ED Medications - No data to display   Initial Impression / Assessment and Plan / ED Course  I have reviewed the triage vital signs and the nursing notes.  Pertinent labs & imaging results that were available during my care of the patient were reviewed by me and considered in my medical decision making (see chart for details).  Clinical Course  Value Comment By Time  DG Foot Complete Right (Reviewed) Linna Darner 07/31 1633   Contusion injury to dorsum of right foot, mild swelling and bruising visible.  Xray negative.  RICE tx advised.  Pt given ace wrap and crutches.  I personally performed the services described in this documentation, which was scribed in my presence. The recorded information has been reviewed and is accurate.     Final Clinical Impressions(s) / ED Diagnoses   Final diagnoses:  Foot contusion, right, initial encounter    New Prescriptions Discharge Medication List as of 09/22/2015  4:43 PM    START taking these medications   Details  ibuprofen (ADVIL,MOTRIN) 800 MG tablet Take 1 tablet (800 mg total) by mouth 3 (three) times daily., Starting Mon 09/22/2015, Print         Danelle Berry, PA-C 09/29/15 0537    Danelle Berry, PA-C 09/29/15 8119    Raeford Razor, MD 09/29/15 1104

## 2015-09-22 NOTE — ED Triage Notes (Signed)
PT refused ice for pain

## 2015-09-29 ENCOUNTER — Ambulatory Visit: Payer: Self-pay | Admitting: Family Medicine

## 2015-10-03 ENCOUNTER — Emergency Department (HOSPITAL_COMMUNITY)
Admission: EM | Admit: 2015-10-03 | Discharge: 2015-10-03 | Disposition: A | Discharge status: Home / Self Care | Payer: Self-pay | Attending: Emergency Medicine | Admitting: Emergency Medicine

## 2015-10-03 ENCOUNTER — Encounter (HOSPITAL_COMMUNITY): Payer: Self-pay | Admitting: Emergency Medicine

## 2015-10-03 ENCOUNTER — Emergency Department (HOSPITAL_COMMUNITY): Payer: Self-pay

## 2015-10-03 ENCOUNTER — Ambulatory Visit: Payer: Self-pay | Admitting: Family Medicine

## 2015-10-03 DIAGNOSIS — F1290 Cannabis use, unspecified, uncomplicated: Secondary | ICD-10-CM | POA: Insufficient documentation

## 2015-10-03 DIAGNOSIS — F17210 Nicotine dependence, cigarettes, uncomplicated: Secondary | ICD-10-CM | POA: Insufficient documentation

## 2015-10-03 DIAGNOSIS — Z79899 Other long term (current) drug therapy: Secondary | ICD-10-CM | POA: Insufficient documentation

## 2015-10-03 DIAGNOSIS — R079 Chest pain, unspecified: Secondary | ICD-10-CM | POA: Insufficient documentation

## 2015-10-03 DIAGNOSIS — M546 Pain in thoracic spine: Secondary | ICD-10-CM

## 2015-10-03 DIAGNOSIS — M62830 Muscle spasm of back: Secondary | ICD-10-CM | POA: Insufficient documentation

## 2015-10-03 LAB — I-STAT TROPONIN, ED: Troponin i, poc: 0.01 ng/mL (ref 0.00–0.08)

## 2015-10-03 LAB — BASIC METABOLIC PANEL
ANION GAP: 6 (ref 5–15)
BUN: 14 mg/dL (ref 6–20)
CHLORIDE: 108 mmol/L (ref 101–111)
CO2: 20 mmol/L — ABNORMAL LOW (ref 22–32)
Calcium: 9 mg/dL (ref 8.9–10.3)
Creatinine, Ser: 0.74 mg/dL (ref 0.44–1.00)
Glucose, Bld: 85 mg/dL (ref 65–99)
POTASSIUM: 3.5 mmol/L (ref 3.5–5.1)
SODIUM: 134 mmol/L — AB (ref 135–145)

## 2015-10-03 LAB — CBC
HEMATOCRIT: 43.5 % (ref 36.0–46.0)
HEMOGLOBIN: 14.7 g/dL (ref 12.0–15.0)
MCH: 31.5 pg (ref 26.0–34.0)
MCHC: 33.8 g/dL (ref 30.0–36.0)
MCV: 93.1 fL (ref 78.0–100.0)
Platelets: 156 10*3/uL (ref 150–400)
RBC: 4.67 MIL/uL (ref 3.87–5.11)
RDW: 13.4 % (ref 11.5–15.5)
WBC: 7.4 10*3/uL (ref 4.0–10.5)

## 2015-10-03 MED ORDER — BUPIVACAINE HCL (PF) 0.5 % IJ SOLN
10.0000 mL | Freq: Once | INTRAMUSCULAR | Status: AC
  Administered 2015-10-03: 10 mL
  Filled 2015-10-03: qty 30

## 2015-10-03 MED ORDER — NAPROXEN 500 MG PO TABS: 500.0000 mg | ORAL_TABLET | Freq: Two times a day (BID) | ORAL | 0 refills | Status: DC

## 2015-10-03 NOTE — Progress Notes (Signed)
Entered in d/c instructions  Susan LivingBERNHARDT, [name], Susan Wang PCP - General Family Medicine [phone] [facility] [address] [place] [id number]   Next Steps: Go on 11/17/2015  Instructions: You have an appointment to establish care with [facility] on 11/17/15 at 2:45 pm

## 2015-10-03 NOTE — ED Triage Notes (Signed)
Patient c/o left chest pain that radiates to left shoulder blade that started this today when patient woke up. Patient reports pain is worse with deep breathing or movement.  Patient having to take shallow breaths. Patient states that she is a smoker.

## 2015-10-03 NOTE — ED Provider Notes (Signed)
[facility]-EMERGENCY DEPT Provider Note   CSN: [id number] Arrival date & time: 10/03/15  1453  First Provider Contact:  First MD Initiated Contact with Patient 10/03/15 1604        History   Chief Complaint Chief Complaint  Patient presents with  . Chest Pain    HPI Susan Wang is a 45 y.o. female.  The history is provided by the patient.  Back Pain   This is a new problem. The current episode started 6 to 12 hours ago. The problem occurs constantly. The problem has been gradually worsening. The pain is associated with no known injury. The pain is present in the thoracic spine. The quality of the pain is described as aching and stabbing. Radiates to: left chest. The pain is moderate. The symptoms are aggravated by bending, twisting and certain positions. The pain is the same all the time. Stiffness is present all day. Associated symptoms include chest pain (on left from back). Pertinent negatives include no fever. She has tried nothing for the symptoms. Risk factors include obesity and lack of exercise.    Past Medical History:  Diagnosis Date  . Anxiety   . Arthritis   . Bronchitis   . Carpal tunnel syndrome   . Carpal tunnel syndrome   . Chest pain 09/2012  . DDD (degenerative disc disease)   . DJD (degenerative joint disease)   . Osteoarthritis    knees  . Plantar fasciitis   . Shortness of breath     Patient Active Problem List   Diagnosis Date Noted  . 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 Plantar fasciitis of left foot 11/01/2012  . Carpal tunnel syndrome, bilateral 11/01/2012  . Preventative health care 11/01/2012  . Palpitations 10/19/2012  . GERD (gastroesophageal reflux disease) 10/14/2012  . DJD (degenerative joint disease)   . Osteoarthritis     Past Surgical History:  Procedure Laterality Date  . CHOLECYSTECTOMY      OB History    No data available       Home Medications    Prior to Admission medications   Medication Sig Start Date  End Date Taking? Authorizing Provider  albuterol (VENTOLIN HFA) 108 (90 Base) MCG/ACT inhaler Inhale 2 puffs into the lungs every 6 (six) hours as needed for wheezing or shortness of breath.   Yes Historical Provider, MD  DiphenhydrAMINE HCl (ALKA-SELTZER PLUS ALLERGY PO) Take 2-4 tablets by mouth 2 (two) times daily as needed (sneezing.).    Yes Historical Provider, MD  ibuprofen (ADVIL,MOTRIN) 800 MG tablet Take 1 tablet (800 mg total) by mouth 3 (three) times daily. Patient taking differently: Take 800 mg by mouth 3 (three) times daily as needed for moderate pain.  09/22/15  Yes Danelle BerryLeisa Tapia, PA-C  methocarbamol (ROBAXIN) 500 MG tablet Take 1 tablet (500 mg total) by mouth 2 (two) times daily. 08/07/15  Yes Jaime Pilcher Ward, PA-C  naproxen (NAPROSYN) 500 MG tablet Take 1 tablet (500 mg total) by mouth 2 (two) times daily. 08/07/15  Yes Jaime Pilcher Ward, PA-C  oxyCODONE-acetaminophen (PERCOCET/ROXICET) 5-325 MG tablet Take 1-2 tablets by mouth every 8 (eight) hours as needed for severe pain. 09/22/15  Yes Danelle BerryLeisa Tapia, PA-C  colchicine 0.6 MG tablet Take 1 tablet (0.6 mg total) by mouth daily. Patient not taking: Reported on 08/07/2015 06/10/14   Junius FinnerErin O'Malley, PA-C  dicyclomine (BENTYL) 20 MG tablet Take 1 tablet (20 mg total) by mouth 2 (two) times daily as needed for spasms (abdominal cramping). Patient  not taking: Reported on 08/07/2015 09/10/14   Trixie Dredge, PA-C  polyethylene glycol [facility] Hal) packet Take 17 g by mouth daily as needed (constipation). Patient not taking: Reported on 08/07/2015 09/10/14   Trixie Dredge, PA-C    Family History Family History  Problem Relation Age of Onset  . Diabetes Father   . Other Mother   . Healthy Sister   . Healthy Brother     Social History Social History  Substance Use Topics  . Smoking status: Current Every Day Smoker    Packs/day: 1.00    Years: 13.00    Types: Cigars, Cigarettes    Last attempt to quit: 05/14/2010  . Smokeless tobacco:  Never Used  . Alcohol use No     Comment: occasionally     Allergies   Other   Review of Systems Review of Systems  Constitutional: Negative for fever.  Cardiovascular: Positive for chest pain (on left from back).  Musculoskeletal: Positive for back pain.  All other systems reviewed and are negative.    Physical Exam Updated Vital Signs BP 161/95 (BP Location: Right Arm)   Pulse (!) 55   Temp 98 F (36.7 C) (Oral)   Resp 20   LMP 09/19/2015   SpO2 99%   Physical Exam  Constitutional: She is oriented to person, place, and time. She appears well-developed and well-nourished. No distress.  HENT:  Head: Normocephalic.  Eyes: Conjunctivae are normal.  Neck: Neck supple. No tracheal deviation present.  Cardiovascular: Normal rate, regular rhythm and normal heart sounds.   Pulmonary/Chest: Effort normal and breath sounds normal. No respiratory distress. She has no wheezes. She has no rales. She exhibits no tenderness.  Abdominal: Soft. She exhibits no distension.  Musculoskeletal:       Thoracic back: She exhibits tenderness, pain and spasm. She exhibits no bony tenderness.       Back:  Neurological: She is alert and oriented to person, place, and time.  Skin: Skin is warm and dry.  Psychiatric: She has a normal mood and affect.     ED Treatments / Results  Labs (all labs ordered are listed, but only abnormal results are displayed) Labs Reviewed  BASIC METABOLIC PANEL - Abnormal; Notable for the following:       Result Value   Sodium 134 (*)    CO2 20 (*)    All other components within normal limits  CBC  I-STAT TROPOININ, ED    EKG  EKG Interpretation  Date/Time:  Friday October 03 2015 15:04:20 EDT Ventricular Rate:  54 PR Interval:    QRS Duration: 88 QT Interval:  446 QTC Calculation: 423 R Axis:   50 Text Interpretation:  Sinus rhythm Interpretation limited secondary to artifact Otherwise normal ECG Confirmed by Zoila Ditullio MD, Morrell Fluke [id number]) on 10/03/2015  4:09:06 PM       Radiology Dg Chest 2 View  Result Date: 10/03/2015 CLINICAL DATA:  Left-sided chest pain for 1 day a EXAM: CHEST  2 VIEW COMPARISON:  09/10/2014 FINDINGS: The heart size and mediastinal contours are within normal limits. Both lungs are clear. The visualized skeletal structures are unremarkable. IMPRESSION: No active cardiopulmonary disease. Electronically Signed   By: Alcide Clever M.D.   On: 10/03/2015 15:36    Procedures Procedures (including critical care time)  Procedure Note: Trigger Point Injection for Myofascial pain  Performed by Dr. Clydene Pugh Indication: muscle/myofascial pain Muscle body and tendon sheath of the left thoracic paraspinal and medial trapezius muscle(s) were injected with  0.5% bupivacaine under sterile technique for release of muscle spasm/pain. Patient tolerated well with immediate improvement of symptoms and no immediate complications following procedure.  CPT Code:   1 or 2 muscle bodies: 20552   Medications Ordered in ED Medications  bupivacaine (MARCAINE) 0.5 % injection 10 mL (10 mLs Infiltration Given 10/03/15 1731)     Initial Impression / Assessment and Plan / ED Course  I have reviewed the triage vital signs and the nursing notes.  Pertinent labs & imaging results that were available during my care of the patient were reviewed by me and considered in my medical decision making (see chart for details).  Clinical Course    45 y.o. female presents with left sided back pain that she woke up with around 8. She notices it more with certain movements and deep breathing. It is isolated to left thoracic back musculature just medial to the scapula. She feels some radiation of this pain into different areas of her back and laterally onto her chest but palpation reproduces symptoms. Heart score is 1, wells, 0, doubt ACS or PE with highly atypical presentation. Trigger point injection applied with relief of symptoms. Patient was recommended to  take short course of scheduled NSAIDs and engage in early mobility as definitive treatment.   Final Clinical Impressions(s) / ED Diagnoses   Final diagnoses:  Acute left-sided thoracic back pain  Back muscle spasm    New Prescriptions Discharge Medication List as of 10/03/2015  5:23 PM       Lyndal Pulley, MD 10/03/15 2159

## 2015-11-17 ENCOUNTER — Ambulatory Visit (INDEPENDENT_AMBULATORY_CARE_PROVIDER_SITE_OTHER): Payer: Self-pay | Admitting: Family Medicine

## 2015-11-17 ENCOUNTER — Encounter: Payer: Self-pay | Admitting: Family Medicine

## 2015-11-17 ENCOUNTER — Other Ambulatory Visit (HOSPITAL_COMMUNITY)
Admission: RE | Admit: 2015-11-17 | Discharge: 2015-11-17 | Disposition: A | Discharge status: Home / Self Care | Payer: Self-pay | Source: Ambulatory Visit | Attending: Family Medicine | Admitting: Family Medicine

## 2015-11-17 VITALS — BP 142/79 | HR 59 | Temp 99.3°F | Resp 14 | Ht 60.0 in | Wt 207.0 lb

## 2015-11-17 DIAGNOSIS — N760 Acute vaginitis: Secondary | ICD-10-CM | POA: Insufficient documentation

## 2015-11-17 DIAGNOSIS — N898 Other specified noninflammatory disorders of vagina: Secondary | ICD-10-CM

## 2015-11-17 DIAGNOSIS — Z0000 Encounter for general adult medical examination without abnormal findings: Secondary | ICD-10-CM

## 2015-11-17 DIAGNOSIS — Z113 Encounter for screening for infections with a predominantly sexual mode of transmission: Secondary | ICD-10-CM | POA: Insufficient documentation

## 2015-11-17 DIAGNOSIS — R300 Dysuria: Secondary | ICD-10-CM

## 2015-11-17 LAB — LIPID PANEL
CHOL/HDL RATIO: 2.9 ratio (ref <=5.0)
Cholesterol: 192 mg/dL (ref 125–200)
HDL: 67 mg/dL (ref >=46)
LDL CALC: 114 mg/dL (ref <130)
Triglycerides: 57 mg/dL (ref <150)
VLDL: 11 mg/dL (ref <30)

## 2015-11-17 MED ORDER — NAPROXEN 500 MG PO TABS: 500.0000 mg | ORAL_TABLET | Freq: Two times a day (BID) | ORAL | 2 refills | Status: DC

## 2015-11-17 MED ORDER — ALBUTEROL SULFATE HFA 108 (90 BASE) MCG/ACT IN AERS: 2.0000 | INHALATION_SPRAY | Freq: Four times a day (QID) | RESPIRATORY_TRACT | 3 refills | Status: DC | PRN

## 2015-11-18 LAB — HEMOGLOBIN A1C
HEMOGLOBIN A1C: 5.1 % (ref <5.7)
MEAN PLASMA GLUCOSE: 100 mg/dL

## 2015-11-18 LAB — HIV ANTIBODY (ROUTINE TESTING W REFLEX): HIV: NONREACTIVE

## 2015-11-19 ENCOUNTER — Other Ambulatory Visit: Payer: Self-pay | Admitting: Family Medicine

## 2015-11-19 ENCOUNTER — Telehealth: Payer: Self-pay

## 2015-11-19 LAB — URINE CYTOLOGY ANCILLARY ONLY
CHLAMYDIA, DNA PROBE: NEGATIVE
Neisseria Gonorrhea: NEGATIVE
Trichomonas: POSITIVE — AB

## 2015-11-19 LAB — URINE CULTURE: Organism ID, Bacteria: NO GROWTH

## 2015-11-19 MED ORDER — METRONIDAZOLE 500 MG PO TABS: 500.0000 mg | ORAL_TABLET | Freq: Two times a day (BID) | ORAL | 0 refills | Status: AC

## 2015-11-19 NOTE — Telephone Encounter (Signed)
Called and spoke with patient advised of positive for Trich. Advised patient to take Flagyl as prescribed and to avoid alcohol while on medication. Advised patient that partner would need to be treated as well. Patient verbalized understanding. Thanks!

## 2015-11-19 NOTE — Progress Notes (Signed)
Susan JacksonRaquel Wang, is a 45 y.o. female  LKG:[id number]:[id number]  GUY:[id number]  DOB - 1971-02-02  CC:  Chief Complaint  Patient presents with  . Establish Care  . Extremity Weakness    in bilateral hands   . Back Pain  . Polyuria    white vaginal discharge        HPI: Susan Wang is a 45 y.o. female here to establish care. She has a hisotry of GERD, Arthritis, DDD,  gout and anxiety. She also has experienced plantar facitis and carpal tunnel syndrome in the past. She has received most of her health care through the ED due to lack of insurance.She also complains today of a white vaginal discharge. She is currently on no regular medications.   Health maintenance: Will return for PAP. Declines immunizations, needs a PAP. Reports smoking 1/2 pack of cigarettes daily, using occ alcohol, no drug use. Advised to stopy  Allergies  Allergen Reactions  . Other Anaphylaxis    "Any melon"    Past Medical History:  Diagnosis Date  . Anxiety   . Arthritis   . Bronchitis   . Carpal tunnel syndrome   . Carpal tunnel syndrome   . Chest pain 09/2012  . DDD (degenerative disc disease)   . DJD (degenerative joint disease)   . Osteoarthritis    knees  . Plantar fasciitis   . Shortness of breath    Current Outpatient Prescriptions on File Prior to Visit  Medication Sig Dispense Refill  . ibuprofen (ADVIL,MOTRIN) 800 MG tablet Take 1 tablet (800 mg total) by mouth 3 (three) times daily. (Patient not taking: Reported on 11/17/2015) 21 tablet 0  . polyethylene glycol (MIRALAX / GLYCOLAX) packet Take 17 g by mouth daily as needed (constipation). (Patient not taking: Reported on 11/17/2015) 14 each 0   No current facility-administered medications on file prior to visit.    Family History  Problem Relation Age of Onset  . Diabetes Father   . Other Mother   . Healthy Sister   . Healthy Brother    Social History   Social History  . Marital status: Single    Spouse name: N/A  . Number of  children: N/A  . Years of education: N/A   Occupational History  . Not on file.   Social History Main Topics  . Smoking status: Current Every Day Smoker    Packs/day: 0.50    Years: 13.00    Types: Cigars, Cigarettes    Last attempt to quit: 05/14/2010  . Smokeless tobacco: Never Used  . Alcohol use No     Comment: occasionally  . Drug use:     Types: Marijuana     Comment: occ  . Sexual activity: Yes    Birth control/ protection: None   Other Topics Concern  . Not on file   Social History Narrative   [place] She has a job.     Review of Systems: Constitutional: Negative Skin: Negative HENT: Negative  Eyes: Negative  Neck: Negative Respiratory: Negative Cardiovascular: Negative Gastrointestinal: Negative Genitourinary: Negative  Musculoskeletal: Negative   Neurological: Negative for Hematological: Negative  Psychiatric/Behavioral: Negative    Objective:   Vitals:   11/17/15 1444  BP: (!) 142/79  Pulse: (!) 59  Resp: 14  Temp: 99.3 F (37.4 C)    Physical Exam: Constitutional: Patient appears well-developed and well-nourished. No distress. HENT: Normocephalic, atraumatic, External right and left ear normal. Oropharynx is clear and moist.  Eyes:  Conjunctivae and EOM are normal. PERRLA, no scleral icterus. Neck: Normal ROM. Neck supple. No lymphadenopathy, No thyromegaly. CVS: RRR, S1/S2 +, no murmurs, no gallops, no rubs Pulmonary: Effort and breath sounds normal, no stridor, rhonchi, wheezes, rales.  Abdominal: Soft. Normoactive BS,, no distension, tenderness, rebound or guarding.  Musculoskeletal: Normal range of motion. No edema and no tenderness.  Neuro: Alert.Normal muscle tone coordination. Non-focal Skin: Skin is warm and dry. No rash noted. Not diaphoretic. No erythema. No pallor. Psychiatric: Normal mood and affect. Behavior, judgment, thought content normal.  Lab Results  Component Value Date   WBC 7.4  10/03/2015   HGB 14.7 10/03/2015   HCT 43.5 10/03/2015   MCV 93.1 10/03/2015   PLT 156 10/03/2015   Lab Results  Component Value Date   CREATININE 0.74 10/03/2015   BUN 14 10/03/2015   NA 134 (L) 10/03/2015   K 3.5 10/03/2015   CL 108 10/03/2015   CO2 20 (L) 10/03/2015    Lab Results  Component Value Date   HGBA1C 5.1 11/17/2015   Lipid Panel     Component Value Date/Time   CHOL 192 11/17/2015 1518   TRIG 57 11/17/2015 1518   HDL 67 11/17/2015 1518   CHOLHDL 2.9 11/17/2015 1518   VLDL 11 11/17/2015 1518   LDLCALC 114 11/17/2015 1518       Assessment and plan:   1. Healthcare maintenance  - Hemoglobin A1c - Lipid panel - HIV antibody (with reflex)  2. Dysuria  - Urine culture  3. Vaginal discharge  - Urine cytology ancillary only  4. Need for mammogram -ordered today.  5. Need for PAP -Will return on 10/19   Return in about 3 weeks (around 12/11/2015) for for PAP.  The patient was given clear instructions to go to ER or return to medical center if symptoms don't improve, worsen or new problems develop. The patient verbalized understanding.    Henrietta Hoover FNP  11/19/2015, 1:36 PM

## 2015-11-19 NOTE — Patient Instructions (Signed)
Will call with any labs needing attention Come back on 10/19 for PAP.

## 2015-11-19 NOTE — Telephone Encounter (Signed)
-----   Message from Henrietta HooverLinda C Bernhardt, NP sent at 11/19/2015 12:39 PM EDT ----- Need to send Flagyl for trich.

## 2015-11-20 LAB — URINE CYTOLOGY ANCILLARY ONLY
BACTERIAL VAGINITIS: POSITIVE — AB
Candida vaginitis: NEGATIVE

## 2015-12-11 ENCOUNTER — Ambulatory Visit: Payer: Self-pay | Admitting: Family Medicine

## 2016-05-18 ENCOUNTER — Ambulatory Visit: Payer: Self-pay | Admitting: Family Medicine

## 2016-08-10 ENCOUNTER — Emergency Department (HOSPITAL_COMMUNITY): Payer: Self-pay

## 2016-08-10 ENCOUNTER — Emergency Department (HOSPITAL_COMMUNITY)
Admission: EM | Admit: 2016-08-10 | Discharge: 2016-08-10 | Disposition: A | Discharge status: Home / Self Care | Payer: Self-pay | Attending: Emergency Medicine | Admitting: Emergency Medicine

## 2016-08-10 ENCOUNTER — Encounter (HOSPITAL_COMMUNITY): Payer: Self-pay | Admitting: Emergency Medicine

## 2016-08-10 DIAGNOSIS — J069 Acute upper respiratory infection, unspecified: Secondary | ICD-10-CM | POA: Insufficient documentation

## 2016-08-10 DIAGNOSIS — B9789 Other viral agents as the cause of diseases classified elsewhere: Secondary | ICD-10-CM

## 2016-08-10 DIAGNOSIS — F17210 Nicotine dependence, cigarettes, uncomplicated: Secondary | ICD-10-CM | POA: Insufficient documentation

## 2016-08-10 MED ORDER — BENZONATATE 100 MG PO CAPS: 100.0000 mg | ORAL_CAPSULE | Freq: Three times a day (TID) | ORAL | 0 refills | Status: DC

## 2016-08-10 NOTE — ED Provider Notes (Signed)
[facility]-EMERGENCY DEPT Provider Note   CSN: [id number] Arrival date & time: 08/10/16  1040  By signing my name below, I, Modena Jansky, attest that this documentation has been prepared under the direction and in the presence of non-physician practitioner, [place] Russo, PA-C. Electronically Signed: Modena Jansky, Scribe. 08/10/2016. 12:23 PM.  History   Chief Complaint Chief Complaint  Patient presents with  . URI   The history is provided by the patient. No language interpreter was used.   HPI Comments: Susan Wang is a 46 y.o. female with a PMHx of bronchitis who presents to the Emergency Department complaining of intermittent moderate cough that started 3 days ago. She describes the cough as dry. She took Alka seltzer cold plus with minimal relief. She reports associated nasal congestion, sore throat, chest tightness, and ear fullness. She admits to a hx of smoking. Denies SOB, fever, N/V, abd pain, headache, difficulty swallowing or other complaints at this time.   PCP: None   Past Medical History:  Diagnosis Date  . Anxiety   . Arthritis   . Bronchitis   . Carpal tunnel syndrome   . Carpal tunnel syndrome   . Chest pain 09/2012  . DDD (degenerative disc disease)   . DJD (degenerative joint disease)   . Osteoarthritis    knees  . Plantar fasciitis   . Shortness of breath     Patient Active Problem List   Diagnosis Date Noted  . 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 Plantar fasciitis of left foot 11/01/2012  . Carpal tunnel syndrome, bilateral 11/01/2012  . Preventative health care 11/01/2012  . Palpitations 10/19/2012  . GERD (gastroesophageal reflux disease) 10/14/2012  . DJD (degenerative joint disease)   . Osteoarthritis     Past Surgical History:  Procedure Laterality Date  . CHOLECYSTECTOMY      OB History    No data available       Home Medications    Prior to Admission medications   Medication Sig Start Date End Date Taking? Authorizing  Provider  albuterol (VENTOLIN HFA) 108 (90 Base) MCG/ACT inhaler Inhale 2 puffs into the lungs every 6 (six) hours as needed for wheezing or shortness of breath. 11/17/15   Henrietta Hoover, NP  benzonatate (TESSALON) 100 MG capsule Take 1 capsule (100 mg total) by mouth every 8 (eight) hours. 08/10/16   Russo, [place] N, PA-C  ibuprofen (ADVIL,MOTRIN) 800 MG tablet Take 1 tablet (800 mg total) by mouth 3 (three) times daily. Patient not taking: Reported on 11/17/2015 09/22/15   Danelle Berry, PA-C  naproxen (NAPROSYN) 500 MG tablet Take 1 tablet (500 mg total) by mouth 2 (two) times daily with a meal. 11/17/15   Henrietta Hoover, NP  polyethylene glycol (MIRALAX / GLYCOLAX) packet Take 17 g by mouth daily as needed (constipation). Patient not taking: Reported on 11/17/2015 09/10/14   Trixie Dredge, PA-C    Family History Family History  Problem Relation Age of Onset  . Diabetes Father   . Other Mother   . Healthy Sister   . Healthy Brother     Social History Social History  Substance Use Topics  . Smoking status: Current Every Day Smoker    Packs/day: 0.50    Years: 13.00    Types: Cigars, Cigarettes    Last attempt to quit: 05/14/2010  . Smokeless tobacco: Never Used  . Alcohol use No     Comment: occasionally     Allergies   Other  Review of Systems Review of Systems  Constitutional: Negative for fever.  HENT: Positive for congestion and sore throat. Negative for trouble swallowing.   Respiratory: Positive for cough and chest tightness. Negative for shortness of breath.   Cardiovascular: Negative for chest pain.  Gastrointestinal: Negative for abdominal pain, nausea and vomiting.     Physical Exam Updated Vital Signs BP (!) 160/93 (BP Location: Right Arm)   Pulse 63   Temp 98.6 F (37 C) (Oral)   Resp 20   SpO2 98%   Physical Exam  Constitutional: She appears well-developed and well-nourished. No distress.  Tolerating secretions  HENT:  Head: Normocephalic and  atraumatic.  Right Ear: Tympanic membrane, external ear and ear canal normal.  Left Ear: External ear and ear canal normal. Tympanic membrane is erythematous (Mild). Tympanic membrane is not retracted and not bulging.  No middle ear effusion.  Mouth/Throat: Uvula is midline and oropharynx is clear and moist. No trismus in the jaw. No uvula swelling.  Eyes: Conjunctivae are normal.  Neck: Normal range of motion. Neck supple.  Cardiovascular: Normal rate, regular rhythm and normal heart sounds.  Exam reveals no gallop and no friction rub.   No murmur heard. Pulmonary/Chest: Effort normal and breath sounds normal. No stridor. No respiratory distress. She has no wheezes. She has no rales.  Abdominal: Soft. She exhibits no distension. There is no tenderness.  Lymphadenopathy:    She has no cervical adenopathy.  Psychiatric: She has a normal mood and affect. Her behavior is normal.  Nursing note and vitals reviewed.    ED Treatments / Results  DIAGNOSTIC STUDIES: Oxygen Saturation is 98% on RA, normal by my interpretation.    COORDINATION OF CARE: 12:27 PM- Pt advised of plan for treatment and pt agrees.  Labs (all labs ordered are listed, but only abnormal results are displayed) Labs Reviewed - No data to display  EKG  EKG Interpretation None       Radiology Dg Chest 2 View  Result Date: 08/10/2016 CLINICAL DATA:  Cough, vomiting, fever EXAM: CHEST  2 VIEW COMPARISON:  Chest x-ray of 10/03/2015 FINDINGS: No active infiltrate or effusion is seen. Mediastinal and hilar contours are unremarkable. The heart is within normal limits in size. No bony abnormality is seen. IMPRESSION: No active cardiopulmonary disease. Electronically Signed   By: Dwyane DeePaul  Barry M.D.   On: 08/10/2016 12:03    Procedures Procedures (including critical care time)  Medications Ordered in ED Medications - No data to display   Initial Impression / Assessment and Plan / ED Course  I have reviewed the triage  vital signs and the nursing notes.  Pertinent labs & imaging results that were available during my care of the patient were reviewed by me and considered in my medical decision making (see chart for details).     Patients symptoms are consistent with URI, likely viral etiology. Pt CXR negative for acute infiltrate. Lungs CTAB.  Pt is afebrile, tolerating secretions, not in distress. Discussed that antibiotics are not indicated for viral infections. Pt will be discharged with symptomatic treatment.  Verbalizes understanding and is agreeable with plan. Pt is hemodynamically stable & in NAD prior to dc.   Discussed results, findings, treatment and follow up. Patient advised of return precautions. Patient verbalized understanding and agreed with plan.   Final Clinical Impressions(s) / ED Diagnoses   Final diagnoses:  Viral URI with cough    New Prescriptions New Prescriptions   BENZONATATE (TESSALON) 100 MG CAPSULE  Take 1 capsule (100 mg total) by mouth every 8 (eight) hours.  I personally performed the services described in this documentation, which was scribed in my presence. The recorded information has been reviewed and is accurate.     Russo, [place] N, PA-C 08/10/16 1240    Tilden Fossa, MD 08/11/16 1038

## 2016-08-10 NOTE — Discharge Instructions (Signed)
Please read instructions below.  You can take tylenol as needed for sore throat or body aches.  Drink plenty of water.  You can use a nasal spray, such as flonase or nasacort, for congestion. You can also use mucinex. Take tessalon every 8 hours as needed for cough. Follow up with your primary care provider as needed. Return to the ER for difficulty swallowing liquids, difficulty breathing, or new or worsening symptoms.

## 2016-08-10 NOTE — ED Triage Notes (Signed)
Pt sts URI and cough with some fever and congestion x 4 days

## 2016-08-10 NOTE — ED Notes (Addendum)
Pt complaining of N/V/D/Fever/Productive Cough/Body Aches/Sore throat.

## 2017-04-08 ENCOUNTER — Encounter (HOSPITAL_COMMUNITY): Payer: Self-pay | Admitting: Emergency Medicine

## 2017-04-08 ENCOUNTER — Other Ambulatory Visit: Payer: Self-pay

## 2017-04-08 ENCOUNTER — Emergency Department (HOSPITAL_COMMUNITY)
Admission: EM | Admit: 2017-04-08 | Discharge: 2017-04-08 | Disposition: A | Discharge status: Home / Self Care | Payer: Self-pay | Attending: Emergency Medicine | Admitting: Emergency Medicine

## 2017-04-08 ENCOUNTER — Emergency Department (HOSPITAL_COMMUNITY): Payer: Self-pay

## 2017-04-08 DIAGNOSIS — M25561 Pain in right knee: Secondary | ICD-10-CM | POA: Insufficient documentation

## 2017-04-08 DIAGNOSIS — M1711 Unilateral primary osteoarthritis, right knee: Secondary | ICD-10-CM | POA: Insufficient documentation

## 2017-04-08 DIAGNOSIS — F17210 Nicotine dependence, cigarettes, uncomplicated: Secondary | ICD-10-CM | POA: Insufficient documentation

## 2017-04-08 DIAGNOSIS — F17290 Nicotine dependence, other tobacco product, uncomplicated: Secondary | ICD-10-CM | POA: Insufficient documentation

## 2017-04-08 DIAGNOSIS — R600 Localized edema: Secondary | ICD-10-CM | POA: Insufficient documentation

## 2017-04-08 MED ORDER — DICLOFENAC SODIUM 75 MG PO TBEC: 75.0000 mg | DELAYED_RELEASE_TABLET | Freq: Two times a day (BID) | ORAL | 0 refills | Status: DC

## 2017-04-08 NOTE — ED Triage Notes (Signed)
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  States that it hurt for about a minute but then it started hurting worse.  States that it started getting swollen/red.  Denies fevers at home.

## 2017-04-08 NOTE — Progress Notes (Signed)
Orthopedic Tech Progress Note Patient Details:  Susan Wang [birth year] [id number]  Ortho Devices Type of Ortho Device: Knee Immobilizer, Crutches Ortho Device/Splint Location: Right leg/knee Ortho Device/Splint Interventions: Application, Adjustment   Post Interventions Patient Tolerated: Ambulated well, Well Instructions Provided: Adjustment of device, Care of device, Poper ambulation with device   Alvina ChouWilliams, Renatta Shrieves C 04/08/2017, 4:52 PM

## 2017-04-08 NOTE — Discharge Instructions (Signed)
Return if any problems.  Schedule to see the Orthopaedist for evaluation °

## 2017-04-08 NOTE — ED Provider Notes (Signed)
[facility] EMERGENCY DEPARTMENT Provider Note   CSN: [id number] Arrival date & time: 04/08/17  1348     History   Chief Complaint Chief Complaint  Patient presents with  . Knee Pain    HPI Susan Wang is a 47 y.o. female.  The history is provided by the patient. No language interpreter was used.  Knee Pain   This is a new problem. The current episode started 2 days ago. The problem occurs constantly. The pain is present in the right knee. The quality of the pain is described as aching. The pain is moderate. Associated symptoms include limited range of motion. She has tried nothing for the symptoms. The treatment provided no relief. There has been no history of extremity trauma.   Pt complains of pain in right knee.  Pt hit her knee over a tray.  Pt reports increased pain and swelling.  Past Medical History:  Diagnosis Date  . Anxiety   . Arthritis   . Bronchitis   . Carpal tunnel syndrome   . Carpal tunnel syndrome   . Chest pain 09/2012  . DDD (degenerative disc disease)   . DJD (degenerative joint disease)   . Osteoarthritis    knees  . Plantar fasciitis   . Shortness of breath     Patient Active Problem List   Diagnosis Date Noted  . 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 Plantar fasciitis of left foot 11/01/2012  . Carpal tunnel syndrome, bilateral 11/01/2012  . Preventative health care 11/01/2012  . Palpitations 10/19/2012  . GERD (gastroesophageal reflux disease) 10/14/2012  . DJD (degenerative joint disease)   . Osteoarthritis     Past Surgical History:  Procedure Laterality Date  . CHOLECYSTECTOMY      OB History    No data available       Home Medications    Prior to Admission medications   Medication Sig Start Date End Date Taking? Authorizing Provider  albuterol (VENTOLIN HFA) 108 (90 Base) MCG/ACT inhaler Inhale 2 puffs into the lungs every 6 (six) hours as needed for wheezing or shortness of breath. 11/17/15    Henrietta Hoover, NP  benzonatate (TESSALON) 100 MG capsule Take 1 capsule (100 mg total) by mouth every 8 (eight) hours. 08/10/16   Robinson, [place] N, PA-C  diclofenac (VOLTAREN) 75 MG EC tablet Take 1 tablet (75 mg total) by mouth 2 (two) times daily. 04/08/17   Elson Areas, PA-C    Family History Family History  Problem Relation Age of Onset  . Diabetes Father   . Other Mother   . Healthy Sister   . Healthy Brother     Social History Social History   Tobacco Use  . Smoking status: Current Every Day Smoker    Packs/day: 0.50    Years: 13.00    Pack years: 6.50    Types: Cigars, Cigarettes    Last attempt to quit: 05/14/2010    Years since quitting: 6.9  . Smokeless tobacco: Never Used  Substance Use Topics  . Alcohol use: No    Comment: occasionally  . Drug use: Yes    Types: Marijuana    Comment: occ     Allergies   Other   Review of Systems Review of Systems  All other systems reviewed and are negative.    Physical Exam Updated Vital Signs BP (!) 141/62 (BP Location: Left Arm)   Pulse (!) 51   Temp 98 F (36.7 C) (  Oral)   Resp 20   Ht 5' (1.524 m)   Wt 81.6 kg (180 lb)   LMP 04/08/2017 (Exact Date)   SpO2 100%   BMI 35.15 kg/m   Physical Exam  Constitutional: She appears well-developed and well-nourished. No distress.  HENT:  Head: Normocephalic and atraumatic.  Right Ear: External ear normal.  Eyes: Pupils are equal, round, and reactive to light.  Neck: Neck supple.  Cardiovascular: Normal rate.  Abdominal: There is no tenderness.  Musculoskeletal: She exhibits edema and tenderness.  Tender swollen right knee,  Pain with movement,  nv and ns intact    Neurological: She is alert.  Skin: Skin is warm and dry.  Psychiatric: She has a normal mood and affect.  Nursing note and vitals reviewed.    ED Treatments / Results  Labs (all labs ordered are listed, but only abnormal results are displayed) Labs Reviewed - No data to  display  EKG  EKG Interpretation None       Radiology Dg Knee Complete 4 Views Right  Result Date: 04/08/2017 CLINICAL DATA:  Sherrine MaplesBlow to the right knee on a server tray 1 week ago with continued pain and swelling. Initial encounter. EXAM: RIGHT KNEE - COMPLETE 4+ VIEW COMPARISON:  Plain films right knee 01/31/2014. FINDINGS: There is no acute bony or joint abnormality. Joint spaces are preserved. Osteophytosis about the knee is worst off the superior pole the patella and unchanged in appearance. No joint effusion. IMPRESSION: No acute abnormality. No change in mild to moderate tricompartmental osteoarthritis. Electronically Signed   By: Drusilla Kannerhomas  Dalessio M.D.   On: 04/08/2017 15:08    Procedures Procedures (including critical care time)  Medications Ordered in ED Medications - No data to display   Initial Impression / Assessment and Plan / ED Course  I have reviewed the triage vital signs and the nursing notes.  Pertinent labs & imaging results that were available during my care of the patient were reviewed by me and considered in my medical decision making (see chart for details).     MDM:  Xray reviewed, pt has osteosrthritis,  I counseled pt on results.  Pt plced in knee imbolizer,  Crutches given.  Pt advised tosee Orthopaedist for evaluation  Final Clinical Impressions(s) / ED Diagnoses   Final diagnoses:  Acute pain of right knee    ED Discharge Orders        Ordered    diclofenac (VOLTAREN) 75 MG EC tablet  2 times daily     04/08/17 1603    An After Visit Summary was printed and given to the patient.   Elson AreasSofia, Enma Maeda K, [place] JerseyPA-C 04/08/17 1642    Shaune PollackIsaacs, [place], MD 04/09/17 [phone]

## 2017-04-08 NOTE — ED Notes (Signed)
PT states understanding of care given, follow up care, and medication prescribed. PT ambulated from ED to car with a steady gait. 

## 2017-07-20 ENCOUNTER — Other Ambulatory Visit: Payer: Self-pay

## 2017-07-20 ENCOUNTER — Encounter (HOSPITAL_COMMUNITY): Payer: Self-pay | Admitting: *Deleted

## 2017-07-20 ENCOUNTER — Emergency Department (HOSPITAL_COMMUNITY): Payer: Self-pay

## 2017-07-20 ENCOUNTER — Emergency Department (HOSPITAL_COMMUNITY)
Admission: EM | Admit: 2017-07-20 | Discharge: 2017-07-20 | Disposition: A | Discharge status: Home / Self Care | Payer: Self-pay | Attending: Emergency Medicine | Admitting: Emergency Medicine

## 2017-07-20 DIAGNOSIS — Z87891 Personal history of nicotine dependence: Secondary | ICD-10-CM | POA: Insufficient documentation

## 2017-07-20 DIAGNOSIS — Z79899 Other long term (current) drug therapy: Secondary | ICD-10-CM | POA: Insufficient documentation

## 2017-07-20 DIAGNOSIS — M5412 Radiculopathy, cervical region: Secondary | ICD-10-CM

## 2017-07-20 MED ORDER — ONDANSETRON 4 MG PO TBDP
4.0000 mg | ORAL_TABLET | Freq: Once | ORAL | Status: AC
  Administered 2017-07-20: 4 mg via ORAL
  Filled 2017-07-20: qty 1

## 2017-07-20 MED ORDER — METHOCARBAMOL 500 MG PO TABS: 500.0000 mg | ORAL_TABLET | Freq: Two times a day (BID) | ORAL | 0 refills | Status: DC

## 2017-07-20 MED ORDER — HYDROCODONE-ACETAMINOPHEN 5-325 MG PO TABS
1.0000 | ORAL_TABLET | Freq: Once | ORAL | Status: AC
  Administered 2017-07-20: 1 via ORAL
  Filled 2017-07-20: qty 1

## 2017-07-20 MED ORDER — TRAMADOL HCL 50 MG PO TABS: 50.0000 mg | ORAL_TABLET | Freq: Four times a day (QID) | ORAL | 0 refills | Status: DC | PRN

## 2017-07-20 NOTE — ED Triage Notes (Signed)
PT cannot sleep, stand or sit.  She did a deep clean on Tuesday and thinks she did something.  She has a headache to one side of head that she describes as a strong aches and she cannot lift right hand without pain.  Has pain with squeezing hands.  Pt has weak grip. No problems with right leg.  Pt has a quick double vision and shakes a little (states this has been going on for over a month). Painful to turn head

## 2017-07-20 NOTE — ED Provider Notes (Signed)
[facility] EMERGENCY DEPARTMENT Provider Note   CSN: [id number] Arrival date & time: 07/20/17  1544     History   Chief Complaint No chief complaint on file.   HPI Susan Wang is a 47 y.o. female.  HPI   47 year old female presents today with complaints of right neck and arm pain.  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  She reports over the last week she has had pain in her right lateral neck shoulder with radiation down into her arm.  She notes the muscles are tender and sore, worse when she moves her neck or arm.  She does note history of degenerative disc disease.  She notes using home remedies as needed for pain.  She denies any loss of sensation strength or motor function.  She denies any headache or dizziness, chest pain or shortness of breath.    Past Medical History:  Diagnosis Date  . Anxiety   . Arthritis   . Bronchitis   . Carpal tunnel syndrome   . Carpal tunnel syndrome   . Chest pain 09/2012  . DDD (degenerative disc disease)   . DJD (degenerative joint disease)   . Osteoarthritis    knees  . Plantar fasciitis   . Shortness of breath     Patient Active Problem List   Diagnosis Date Noted  . 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 Plantar fasciitis of left foot 11/01/2012  . Carpal tunnel syndrome, bilateral 11/01/2012  . Preventative health care 11/01/2012  . Palpitations 10/19/2012  . GERD (gastroesophageal reflux disease) 10/14/2012  . DJD (degenerative joint disease)   . Osteoarthritis     Past Surgical History:  Procedure Laterality Date  . CHOLECYSTECTOMY       OB History   None      Home Medications    Prior to Admission medications   Medication Sig Start Date End Date Taking? Authorizing Provider  albuterol (VENTOLIN HFA) 108 (90 Base) MCG/ACT inhaler Inhale 2 puffs into the lungs every 6 (six) hours as needed for wheezing or shortness of breath. 11/17/15   Henrietta Hoover, NP  benzonatate  (TESSALON) 100 MG capsule Take 1 capsule (100 mg total) by mouth every 8 (eight) hours. 08/10/16   Robinson, [place] N, PA-C  diclofenac (VOLTAREN) 75 MG EC tablet Take 1 tablet (75 mg total) by mouth 2 (two) times daily. 04/08/17   Elson Areas, PA-C  methocarbamol (ROBAXIN) 500 MG tablet Take 1 tablet (500 mg total) by mouth 2 (two) times daily. 07/20/17   Elmus Mathes, Tinnie Gens, PA-C  traMADol (ULTRAM) 50 MG tablet Take 1 tablet (50 mg total) by mouth every 6 (six) hours as needed. 07/20/17   Eyvonne Mechanic, PA-C    Family History Family History  Problem Relation Age of Onset  . Diabetes Father   . Other Mother   . Healthy Sister   . Healthy Brother     Social History Social History   Tobacco Use  . Smoking status: Former Smoker    Packs/day: 0.50    Years: 13.00    Pack years: 6.50    Types: Cigars, Cigarettes    Last attempt to quit: 05/14/2010    Years since quitting: 7.1  . Smokeless tobacco: Never Used  Substance Use Topics  . Alcohol use: No    Comment: occasionally  . Drug use: Yes    Types: Marijuana    Comment: occ     Allergies   Other  Review of Systems Review of Systems  All other systems reviewed and are negative.    Physical Exam Updated Vital Signs BP 115/69 (BP Location: Left Arm)   Pulse (!) 54   Temp 98.4 F (36.9 C) (Oral)   Resp 15   LMP 07/10/2017 (Approximate)   SpO2 98%   Physical Exam  Constitutional: She is oriented to person, place, and time. She appears well-developed and well-nourished.  HENT:  Head: Normocephalic and atraumatic.  Eyes: Pupils are equal, round, and reactive to light. Conjunctivae are normal. Right eye exhibits no discharge. Left eye exhibits no discharge. No scleral icterus.  Neck: Normal range of motion. No JVD present. No tracheal deviation present.  Pulmonary/Chest: Effort normal. No stridor.  Musculoskeletal:  Tenderness palpation of the right lateral cervical musculature, trapezius, and shoulder diffusely, no  redness swelling or edema, limited range of motion the shoulder secondary to pain, grip strength 5 out of 5 sensation intact radial pulse 2+  Neurological: She is alert and oriented to person, place, and time. Coordination normal.  Psychiatric: She has a normal mood and affect. Her behavior is normal. Judgment and thought content normal.  Nursing note and vitals reviewed.    ED Treatments / Results  Labs (all labs ordered are listed, but only abnormal results are displayed) Labs Reviewed - No data to display  EKG None  Radiology Ct Cervical Spine Wo Contrast  Result Date: 07/20/2017 CLINICAL DATA:  Neck pain EXAM: CT CERVICAL SPINE WITHOUT CONTRAST TECHNIQUE: Multidetector CT imaging of the cervical spine was performed without intravenous contrast. Multiplanar CT image reconstructions were also generated. COMPARISON:  None. FINDINGS: Alignment: Cervical lordosis is reversed. Skull base and vertebrae: No acute fracture.  No dislocation. Soft tissues and spinal canal: No obvious spinal hematoma. No soft tissue hematoma. Disc levels:  C2-3: Unremarkable C3-4: Posterior osteophytic ridging. C4-5: Posterior osteophytic ridging and disc bulge complex asymmetric to the right results in right-sided central canal stenosis. Uncovertebral osteophytes cause severe right foraminal narrowing and mild left foraminal narrowing. C5-6: Posterior osteophytes cause significant right-sided central canal narrowing. There are right-sided uncovertebral osteophytes resulting in severe right foraminal narrowing. C6-7: Mild posterior osteophytic ridging. Central disc herniation cannot be excluded. Soft tissue evaluation is limited. Uncovertebral osteophytes cause severe left foraminal narrowing. Upper chest: Negative. Other: Noncontributory IMPRESSION: No acute bony injury. Degenerative changes are delineated above. 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 This may be related  to the patient's symptoms. Evaluation of the discs is limited due to lack of intrathecal contrast. Electronically Signed   By: Jolaine Click M.D.   On: 07/20/2017 18:26    Procedures Procedures (including critical care time)  Medications Ordered in ED Medications  HYDROcodone-acetaminophen (NORCO/VICODIN) 5-325 MG per tablet 1 tablet (1 tablet Oral Given 07/20/17 1747)  ondansetron (ZOFRAN-ODT) disintegrating tablet 4 mg (4 mg Oral Given 07/20/17 1747)     Initial Impression / Assessment and Plan / ED Course  I have reviewed the triage vital signs and the nursing notes.  Pertinent labs & imaging results that were available during my care of the patient were reviewed by me and considered in my medical decision making (see chart for details).     47 year old female presents today with likely radiculopathy.  She has a CT scan showing degenerative changes right-sided central stenosis and right foraminal narrowing at C4-5 and C5-6.  Patient has no acute strength deficits, no need for further imaging or evaluation here in the ED.  Patient will be placed in a sling for comfort, she will be given a short course of pain medication encouraged to follow-up with neurosurgery if symptoms persist, return to emergency room if they worsen.  She verbalized understanding and agreement to today's plan had no further questions or concerns.   Final Clinical Impressions(s) / ED Diagnoses   Final diagnoses:  Cervical radiculopathy    ED Discharge Orders        Ordered    traMADol (ULTRAM) 50 MG tablet  Every 6 hours PRN     07/20/17 1855    methocarbamol (ROBAXIN) 500 MG tablet  2 times daily     07/20/17 1855       Eyvonne Mechanic, PA-C 07/20/17 1856    Doug Sou, MD 07/20/17 [phone]

## 2017-07-20 NOTE — ED Notes (Signed)
Patient verbalized understanding of discharge instructions, prescriptions, and proper use of arm sling. She denies any further needs or questions at this time. VS stable. Patient ambulatory with steady gait.

## 2017-07-20 NOTE — ED Provider Notes (Signed)
Patient placed in Quick Look pathway, seen and evaluated   Chief Complaint: R neck pain and new numbness and tingling  HPI:   Patient presents with cc of R neck pain . She has associated Paresthesia and pain in fingers 2-5. Hx of CTS but her sxs are new and different. Patient states that prior to the onset of sxs she was doing " deep cleaning" at her work  ROS: Neck pain.  (one)  Physical Exam:   Gen: No distress  Neuro: Awake and Alert  Skin: Warm    Focused Exam: Patient guarding. Pain to palpation of the trapeziux    Initiation of care has begun. The patient has been counseled on the process, plan, and necessity for staying for the completion/evaluation, and the remainder of the medical screening examination    Arthor Captain, PA-C 07/20/17 1820    Abelino Derrick, MD 07/21/17 2357

## 2017-07-20 NOTE — Discharge Instructions (Addendum)
Please read attached information. If you experience any new or worsening signs or symptoms please return to the emergency room for evaluation. Please follow-up with your primary care provider or specialist as discussed. Please use medication prescribed only as directed and discontinue taking if you have any concerning signs or symptoms.   °

## 2017-08-30 ENCOUNTER — Emergency Department (HOSPITAL_COMMUNITY): Payer: Self-pay

## 2017-08-30 ENCOUNTER — Emergency Department (HOSPITAL_COMMUNITY)
Admission: EM | Admit: 2017-08-30 | Discharge: 2017-08-30 | Disposition: A | Discharge status: Home / Self Care | Payer: Self-pay | Attending: Emergency Medicine | Admitting: Emergency Medicine

## 2017-08-30 ENCOUNTER — Encounter (HOSPITAL_COMMUNITY): Payer: Self-pay | Admitting: Emergency Medicine

## 2017-08-30 ENCOUNTER — Other Ambulatory Visit: Payer: Self-pay

## 2017-08-30 DIAGNOSIS — Z79899 Other long term (current) drug therapy: Secondary | ICD-10-CM | POA: Insufficient documentation

## 2017-08-30 DIAGNOSIS — M25571 Pain in right ankle and joints of right foot: Secondary | ICD-10-CM | POA: Insufficient documentation

## 2017-08-30 DIAGNOSIS — Z87891 Personal history of nicotine dependence: Secondary | ICD-10-CM | POA: Insufficient documentation

## 2017-08-30 MED ORDER — NAPROXEN 250 MG PO TABS
500.0000 mg | ORAL_TABLET | Freq: Once | ORAL | Status: AC
Start: 2017-08-30 — End: 2017-08-30
  Administered 2017-08-30: 500 mg via ORAL
  Filled 2017-08-30: qty 2

## 2017-08-30 MED ORDER — NAPROXEN 500 MG PO TABS: 500.0000 mg | ORAL_TABLET | Freq: Two times a day (BID) | ORAL | 0 refills | Status: DC

## 2017-08-30 NOTE — Discharge Instructions (Addendum)
You were seen in the emergency department today for right ankle and foot pain.  Your x-rays did not show any fractures or dislocations, you do have a small spur on your heel bone.  We are unsure of the exact cause of your pain, this may be gout, or not entirely sure.  We are treating this with naproxen. Naproxen is a nonsteroidal anti-inflammatory medication that will help with pain and swelling. Be sure to take this medication as prescribed with food, 1 pill every 12 hours,  It should be taken with food, as it can cause stomach upset, and more seriously, stomach bleeding. Do not take other nonsteroidal anti-inflammatory medications with this such as Advil, Motrin, or Aleve.  You may take Tylenol per over-the-counter dosing with this medication safely.  We have prescribed you new medication(s) today. Discuss the medications prescribed today with your pharmacist as they can have adverse effects and interactions with your other medicines including over the counter and prescribed medications. Seek medical evaluation if you start to experience new or abnormal symptoms after taking one of these medicines, seek care immediately if you start to experience difficulty breathing, feeling of your throat closing, facial swelling, or rash as these could be indications of a more serious allergic reaction   We have also provided you with crutches and ankle brace.  Please keep the brace on to compress the area.  You may also ice it 20 minutes on 40 minutes off for the next 24 to 48 hours with the ice wrapped in a towel.  Please keep the area elevated as much as possible.  If your toes become numb or turn blue please take the brace off and return to the emergency department.  These follow-up with the orthopedic doctor providing her discharge instructions within 1 week for reevaluation.  Return to the ER for any new or worsening symptoms including but not limited to fever, redness to this area, warmth to the area, worsening  pain, or any other concerns.

## 2017-08-30 NOTE — ED Notes (Signed)
Patient transported to X-ray 

## 2017-08-30 NOTE — ED Notes (Signed)
Pt refuses crutches states she several pair at home.

## 2017-08-30 NOTE — ED Triage Notes (Signed)
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

## 2017-08-30 NOTE — ED Provider Notes (Signed)
[facility] EMERGENCY DEPARTMENT Provider Note   CSN: [id number] Arrival date & time: 08/30/17  1037     History   Chief Complaint Chief Complaint  Patient presents with  . Foot Pain    HPI Susan Wang is a 47 y.o. female with a hx of gout and plantar fascitis who presents to the ED with complaints of R ankle/foot pain that started  Yesterday. Pain is to the dorsum of the foot and medial ankle. Pain is rated a 50/10 in severity, worse with movement and with weightbearing (has been unable to weightbear), alleviated by smoking marijuana. She states she has noted the area has associated swelling. She has not noted any color change or warmth to the area. States this does not necessarily feel similar to previous gout. 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 Denies numbness or weakness. Denies fevers.   HPI  Past Medical History:  Diagnosis Date  . Anxiety   . Arthritis   . Bronchitis   . Carpal tunnel syndrome   . Carpal tunnel syndrome   . Chest pain 09/2012  . DDD (degenerative disc disease)   . DJD (degenerative joint disease)   . Osteoarthritis    knees  . Plantar fasciitis   . Shortness of breath     Patient Active Problem List   Diagnosis Date Noted  . 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 Plantar fasciitis of left foot 11/01/2012  . Carpal tunnel syndrome, bilateral 11/01/2012  . Preventative health care 11/01/2012  . Palpitations 10/19/2012  . GERD (gastroesophageal reflux disease) 10/14/2012  . DJD (degenerative joint disease)   . Osteoarthritis     Past Surgical History:  Procedure Laterality Date  . CHOLECYSTECTOMY       OB History   None      Home Medications    Prior to Admission medications   Medication Sig Start Date End Date Taking? Authorizing Provider  albuterol (VENTOLIN HFA) 108 (90 Base) MCG/ACT inhaler Inhale 2 puffs into the lungs  every 6 (six) hours as needed for wheezing or shortness of breath. 11/17/15   Henrietta Hoover, NP  benzonatate (TESSALON) 100 MG capsule Take 1 capsule (100 mg total) by mouth every 8 (eight) hours. 08/10/16   Robinson, [place] N, PA-C  diclofenac (VOLTAREN) 75 MG EC tablet Take 1 tablet (75 mg total) by mouth 2 (two) times daily. 04/08/17   Elson Areas, PA-C  methocarbamol (ROBAXIN) 500 MG tablet Take 1 tablet (500 mg total) by mouth 2 (two) times daily. 07/20/17   Hedges, Tinnie Gens, PA-C  traMADol (ULTRAM) 50 MG tablet Take 1 tablet (50 mg total) by mouth every 6 (six) hours as needed. 07/20/17   Eyvonne Mechanic, PA-C    Family History Family History  Problem Relation Age of Onset  . Diabetes Father   . Other Mother   . Healthy Sister   . Healthy Brother     Social History Social History   Tobacco Use  . Smoking status: Former Smoker    Packs/day: 0.50    Years: 13.00    Pack years: 6.50    Types: Cigars, Cigarettes    Last attempt to quit: 05/14/2010    Years since quitting: 7.3  . Smokeless tobacco: Never Used  Substance Use Topics  . Alcohol use: No    Comment: occasionally  . Drug use: Yes  Types: Marijuana    Comment: occ     Allergies   Other   Review of Systems Review of Systems  Constitutional: Negative for chills and fever.  Musculoskeletal: Positive for arthralgias and joint swelling.  Skin: Negative for color change.  Neurological: Negative for weakness and numbness.   Physical Exam Updated Vital Signs There were no vitals taken for this visit.  Physical Exam  Constitutional: She appears well-developed and well-nourished. No distress.  HENT:  Head: Normocephalic and atraumatic.  Eyes: Conjunctivae are normal. Right eye exhibits no discharge. Left eye exhibits no discharge.  Cardiovascular:  Pulses:      Dorsalis pedis pulses are 2+ on the right side, and 2+ on the left side.       Posterior tibial pulses are 2+ on the right side, and 2+ on the  left side.  Musculoskeletal:  Lower extremities: Mild soft tissue swelling to the medial R ankle area. No obvious deformity, erythema, ecchymosis, overlying warmth, or open wounds. Patient has normal range of motion at the knees and she is able to move all digits.  She is having significant pain with right ankle plantar and dorsiflexion bilaterally and is able to perform these movements somewhat.  She has normal range of motion of the left ankle.  He is tender to palpation over the some of the mid and forefoot as well as the medial aspect of the ankle.  Medial aspect of the ankle is tender to very minimal/light touch.  No lateral malleolar tenderness.  No tenderness at the base of the fifth.  No tenderness of the calf.  Compartments are soft.  Neurovascularly intact distally.   Neurological: She is alert.  Clear speech. Sensation grossly intact to bilateral lower extremities. Ankle plantar/dorsiflexion strength difficult to assess secondary to pain. Patient unable to bear weight on R foot.   Psychiatric: She has a normal mood and affect. Her behavior is normal. Thought content normal.  Nursing note and vitals reviewed.    ED Treatments / Results  Labs (all labs ordered are listed, but only abnormal results are displayed) Labs Reviewed - No data to display  EKG None  Radiology Dg Ankle Complete Right  Result Date: 08/30/2017 CLINICAL DATA:  Tripped yesterday, RIGHT foot and ankle pain EXAM: RIGHT ANKLE - COMPLETE 3+ VIEW COMPARISON:  None FINDINGS: Diffuse soft tissue swelling, greater laterally. Osseous mineralization normal. Joint spaces preserved. Small calcified ossicle adjacent to medial malleolus, corticated and old. No acute fracture, dislocation, or bone destruction. Small plantar calcaneal spur. IMPRESSION: Soft tissue swelling. Small plantar calcaneal spur. No acute abnormalities. Electronically Signed   By: Ulyses Southward M.D.   On: 08/30/2017 11:54   Dg Foot Complete Right  Result  Date: 08/30/2017 CLINICAL DATA:  Tripped yesterday, RIGHT foot and ankle pain EXAM: RIGHT FOOT COMPLETE - 3+ VIEW COMPARISON:  09/22/2015 FINDINGS: Osseous mineralization normal. Joint spaces preserved. Small plantar calcaneal spur. Probable mild soft tissue swelling at dorsum of foot overlying distal metatarsals. No acute fracture, dislocation or bone destruction. IMPRESSION: No acute osseous abnormalities. Electronically Signed   By: Ulyses Southward M.D.   On: 08/30/2017 11:55    Procedures Procedures (including critical care time)  Medications Ordered in ED Medications - No data to display   Initial Impression / Assessment and Plan / ED Course  I have reviewed the triage vital signs and the nursing notes.  Pertinent labs & imaging results that were available during my care of the patient were reviewed by me  and considered in my medical decision making (see chart for details).   Patient presents to the emergency department with right foot/ankle pain that started yesterday.  Patient nontoxic-appearing, in no apparent distress, vitals without significant abnormality.  On exam she has tender to palpation over the dorsum of the foot and the medial ankle.  Medial ankle with exquisite tenderness to  light palpation.  X-rays obtained and negative for acute osseous abnormality, there is a calcaneal spur present and some soft tissue swelling.  Patient is afebrile and without overlying erythema or warmth to raise suspicion for infectious source such as cellulitis or septic joint.  Pain/swelling does not extend to calf area, doubt DVT.  Unclear definitive etiology to patient's symptoms, considering gout given exquisite tenderness to light touch, however not monoarticular.  Will treat with naproxen, place patient in ASO and provide crutches with orthopedics follow-up. I discussed results, treatment plan, need for follow-up, and return precautions with the patient. Provided opportunity for questions, patient  confirmed understanding and is in agreement with plan.    Final Clinical Impressions(s) / ED Diagnoses   Final diagnoses:  Acute right ankle pain    ED Discharge Orders        Ordered    naproxen (NAPROSYN) 500 MG tablet  2 times daily     08/30/17 [address]       Jeanne Terrance R, PA-C 08/30/17 1722    Benjiman CorePickering, Nathan, MD 08/31/17 [phone]

## 2018-08-16 ENCOUNTER — Emergency Department (HOSPITAL_BASED_OUTPATIENT_CLINIC_OR_DEPARTMENT_OTHER): Payer: Commercial Managed Care - PPO

## 2018-08-16 ENCOUNTER — Other Ambulatory Visit: Payer: Self-pay

## 2018-08-16 ENCOUNTER — Emergency Department (HOSPITAL_COMMUNITY)
Admission: EM | Admit: 2018-08-16 | Discharge: 2018-08-16 | Disposition: A | Discharge status: Home / Self Care | Payer: Commercial Managed Care - PPO | Attending: Emergency Medicine | Admitting: Emergency Medicine

## 2018-08-16 ENCOUNTER — Emergency Department (HOSPITAL_COMMUNITY): Payer: Commercial Managed Care - PPO

## 2018-08-16 DIAGNOSIS — R0789 Other chest pain: Secondary | ICD-10-CM | POA: Insufficient documentation

## 2018-08-16 DIAGNOSIS — Z87891 Personal history of nicotine dependence: Secondary | ICD-10-CM | POA: Insufficient documentation

## 2018-08-16 DIAGNOSIS — F1290 Cannabis use, unspecified, uncomplicated: Secondary | ICD-10-CM | POA: Insufficient documentation

## 2018-08-16 DIAGNOSIS — M7918 Myalgia, other site: Secondary | ICD-10-CM | POA: Insufficient documentation

## 2018-08-16 DIAGNOSIS — R0602 Shortness of breath: Secondary | ICD-10-CM

## 2018-08-16 DIAGNOSIS — R52 Pain, unspecified: Secondary | ICD-10-CM | POA: Diagnosis not present

## 2018-08-16 LAB — CBC WITH DIFFERENTIAL/PLATELET
Abs Immature Granulocytes: 0.01 10*3/uL (ref 0.00–0.07)
Basophils Absolute: 0.1 10*3/uL (ref 0.0–0.1)
Basophils Relative: 1 %
Eosinophils Absolute: 0.1 10*3/uL (ref 0.0–0.5)
Eosinophils Relative: 2 %
HCT: 43.9 % (ref 36.0–46.0)
Hemoglobin: 14.4 g/dL (ref 12.0–15.0)
Immature Granulocytes: 0 %
Lymphocytes Relative: 42 %
Lymphs Abs: 3 10*3/uL (ref 0.7–4.0)
MCH: 31.1 pg (ref 26.0–34.0)
MCHC: 32.8 g/dL (ref 30.0–36.0)
MCV: 94.8 fL (ref 80.0–100.0)
Monocytes Absolute: 0.7 10*3/uL (ref 0.1–1.0)
Monocytes Relative: 9 %
Neutro Abs: 3.3 10*3/uL (ref 1.7–7.7)
Neutrophils Relative %: 46 %
Platelets: 152 10*3/uL (ref 150–400)
RBC: 4.63 MIL/uL (ref 3.87–5.11)
RDW: 12.9 % (ref 11.5–15.5)
WBC: 7.2 10*3/uL (ref 4.0–10.5)
nRBC: 0 % (ref 0.0–0.2)

## 2018-08-16 LAB — POC URINE PREG, ED: Preg Test, Ur: NEGATIVE

## 2018-08-16 LAB — TROPONIN I (HIGH SENSITIVITY)
Troponin I (High Sensitivity): 3 ng/L (ref <18)
Troponin I (High Sensitivity): 4 ng/L (ref <18)

## 2018-08-16 LAB — BASIC METABOLIC PANEL
Anion gap: 10 (ref 5–15)
BUN: 12 mg/dL (ref 6–20)
CO2: 19 mmol/L — ABNORMAL LOW (ref 22–32)
Calcium: 9 mg/dL (ref 8.9–10.3)
Chloride: 108 mmol/L (ref 98–111)
Creatinine, Ser: 0.69 mg/dL (ref 0.44–1.00)
GFR calc Af Amer: >60 mL/min (ref >60)
GFR calc non Af Amer: >60 mL/min (ref >60)
Glucose, Bld: 92 mg/dL (ref 70–99)
Potassium: 3.9 mmol/L (ref 3.5–5.1)
Sodium: 137 mmol/L (ref 135–145)

## 2018-08-16 LAB — D-DIMER, QUANTITATIVE: D-Dimer, Quant: 0.33 ug/mL-FEU (ref 0.00–0.50)

## 2018-08-16 MED ORDER — SODIUM CHLORIDE 0.9 % IV BOLUS
500.0000 mL | Freq: Once | INTRAVENOUS | Status: AC
  Administered 2018-08-16: 500 mL via INTRAVENOUS

## 2018-08-16 MED ORDER — NAPROXEN 375 MG PO TABS: 375.0000 mg | ORAL_TABLET | Freq: Two times a day (BID) | ORAL | 0 refills | Status: DC

## 2018-08-16 MED ORDER — METHOCARBAMOL 500 MG PO TABS: 500.0000 mg | ORAL_TABLET | Freq: Two times a day (BID) | ORAL | 0 refills | Status: DC

## 2018-08-16 NOTE — Discharge Instructions (Signed)
You have been diagnosed today with atypical chest pain, musculoskeletal chest pain.  At this time there does not appear to be the presence of an emergent medical condition, however there is always the potential for conditions to change. Please read and follow the below instructions.  Please return to the Emergency Department immediately for any new or worsening symptoms. Please be sure to follow up with your Primary Care Provider within one week regarding your visit today; please call their office to schedule an appointment even if you are feeling better for a follow-up visit. You may take the muscle relaxer Robaxin as prescribed to help with your musculoskeletal chest wall pain.  Do not drive or operate heavy machinery while taking Robaxin as will make you drowsy.  Do not drink alcohol or take other sedating medications while taking Robaxin as this will worsen side effects.  Additionally you may take naproxen as prescribed to help with your musculoskeletal pain, take this medication with food and drink plenty water while taking.  Call your primary care doctor's office today to schedule a follow-up appointment.  Get help right away if: Your chest pain gets worse. You have a cough that gets worse, or you cough up blood. You have severe pain in your abdomen. You faint. You have sudden, unexplained chest discomfort. You have sudden, unexplained discomfort in your arms, back, neck, or jaw. You have shortness of breath at any time. You suddenly start to sweat, or your skin gets clammy. You feel nausea or you vomit. You suddenly feel lightheaded or dizzy. You have severe weakness, or unexplained weakness or fatigue. Your heart begins to beat quickly, or it feels like it is skipping beats. Any new/concerning or worsening symptoms  Please read the additional information packets attached to your discharge summary.  Do not take your medicine if  develop an itchy rash, swelling in your mouth or lips,  or difficulty breathing; call 911 and seek immediate emergency medical attention if this occurs.

## 2018-08-16 NOTE — ED Notes (Signed)
Tried to get patient blood, But I didn't have any success.

## 2018-08-16 NOTE — ED Provider Notes (Signed)
[facility] DEPARTMENT Provider Note   CSN: [id number] Arrival date & time: 08/16/18  0820    History   Chief Complaint Chief Complaint  Patient presents with   Chest Pain    HPI Susan Wang is a 48 y.o. female with history of GERD, anxiety, smoker presents today for sudden onset of chest pain.  Patient reports approximately 20 minutes prior to arrival she was driving to work when she had a severe sharp left-sided chest pain that has been constant since onset worsened with movement and deep breathing and without alleviating factors.  Patient reports some shortness of breath with deep inhalation, none with regular breathing.  Patient reports that pain occasionally wraps around her left side to her back.  She reports that she has never had this prior.  Denies fever/chills, cough, abdominal pain, nausea/vomiting, diarrhea, family history of heart disease under the age of 78, history of blood clot, recent surgery/trauma, injury, hypertension/hyperlipidemia, CAD, stroke, diabetes or hemoptysis.  She has no additional concerns today.    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      Home  Medications    Prior to Admission medications   Medication Sig Start Date End Date Taking? Authorizing Provider  albuterol (VENTOLIN HFA) 108 (90 Base) MCG/ACT inhaler Inhale 2 puffs into the lungs every 6 (six) hours as needed for wheezing or shortness of breath. 11/17/15   Micheline Chapman, NP  benzonatate (TESSALON) 100 MG capsule Take 1 capsule (100 mg total) by mouth every 8 (eight) hours. 08/10/16   Robinson, [place] N, PA-C  methocarbamol (ROBAXIN) 500 MG tablet Take 1 tablet (500 mg total) by mouth 2 (two) times daily. 08/16/18   Nuala Alpha A, PA-C  naproxen (NAPROSYN) 375 MG tablet Take 1 tablet (375 mg total) by mouth 2 (two) times daily. 08/16/18   Nuala Alpha A, PA-C  traMADol (ULTRAM) 50 MG tablet Take 1 tablet (50 mg total) by mouth every 6 (six) hours as needed. 07/20/17   Okey Regal, PA-C    Family History Family History  Problem Relation Age of Onset   Diabetes Father    Other Mother    Healthy Sister    Healthy Brother     Social History Social History   Tobacco Use   Smoking status: Former Smoker    Packs/day: 0.50    Years: 13.00    Pack years: 6.50    Types: Cigars, Cigarettes    Quit date: 05/14/2010    Years since quitting: 8.2   Smokeless tobacco: Never Used  Substance Use Topics   Alcohol  use: No    Comment: occasionally   Drug use: Yes    Types: Marijuana    Comment: occ     Allergies   Other   Review of Systems Review of Systems  Constitutional: Negative.  Negative for chills and fever.  Respiratory: Positive for shortness of breath (Only with deep breath). Negative for cough and wheezing.   Cardiovascular: Positive for chest pain. Negative for palpitations.  Gastrointestinal: Negative.  Negative for abdominal pain, diarrhea, nausea and vomiting.  Musculoskeletal: Positive for myalgias (Reports bilateral calf tenderness left greater than right). Negative for arthralgias and joint swelling.  Neurological: Negative.   Negative for weakness, numbness and headaches.  All other systems reviewed and are negative.  Physical Exam Updated Vital Signs BP 127/65    Pulse (!) 55    Temp 98.5 F (36.9 C) (Oral)    Resp (!) 24    Ht 5' (1.524 m)    Wt 86.2 kg    SpO2 97%    BMI 37.11 kg/m   Physical Exam Constitutional:      General: She is not in acute distress.    Appearance: Normal appearance. She is well-developed. She is not ill-appearing or diaphoretic.  HENT:     Head: Normocephalic and atraumatic.     Right Ear: External ear normal.     Left Ear: External ear normal.     Nose: Nose normal.  Eyes:     General: Vision grossly intact. Gaze aligned appropriately.     Pupils: Pupils are equal, round, and reactive to light.  Neck:     Musculoskeletal: Normal range of motion.     Trachea: Trachea and phonation normal. No tracheal deviation.  Cardiovascular:     Rate and Rhythm: Normal rate and regular rhythm.     Pulses:          Radial pulses are 2+ on the right side and 2+ on the left side.       Dorsalis pedis pulses are 2+ on the right side and 2+ on the left side.     Heart sounds: Normal heart sounds.  Pulmonary:     Effort: Pulmonary effort is normal. No accessory muscle usage or respiratory distress.     Breath sounds: Normal breath sounds and air entry.  Chest:     Chest wall: Tenderness present. No deformity or crepitus.       Comments: Patient with consistently reproducible tenderness to light palpation of the left chest wall Abdominal:     General: There is no distension.     Palpations: Abdomen is soft.     Tenderness: There is no abdominal tenderness. There is no guarding or rebound.  Musculoskeletal: Normal range of motion.     Right lower leg: She exhibits tenderness. She exhibits no swelling. No edema.     Left lower leg: She exhibits tenderness. She exhibits no swelling. No edema.  Skin:    General: Skin is warm and dry.  Neurological:     Mental Status: She is alert.      GCS: GCS eye subscore is 4. GCS verbal subscore is 5. GCS motor subscore is 6.     Comments: Speech is clear and goal oriented, follows commands Major Cranial nerves without deficit, no facial droop Moves extremities without ataxia, coordination intact  Psychiatric:        Behavior: Behavior normal.    ED Treatments / Results  Labs (all labs ordered are listed, but only abnormal results  are displayed) Labs Reviewed  BASIC METABOLIC PANEL - Abnormal; Notable for the following components:      Result Value   CO2 19 (*)    All other components within normal limits  CBC WITH DIFFERENTIAL/PLATELET  TROPONIN I (HIGH SENSITIVITY)  D-DIMER, QUANTITATIVE (NOT [facility])  TROPONIN I (HIGH SENSITIVITY)  TROPONIN I (HIGH SENSITIVITY)  POC URINE PREG, ED    EKG EKG Interpretation  Date/Time:  Wednesday August 16 2018 08:30:50 EDT Ventricular Rate:  61 PR Interval:    QRS Duration: 79 QT Interval:  457 QTC Calculation: 461 R Axis:   38 Text Interpretation:  Sinus rhythm No significant change since last tracing Confirmed by Raeford RazorKohut, Stephen [id number]) on 08/16/2018 8:39:52 AM   Radiology Dg Chest 2 View  Result Date: 08/16/2018 CLINICAL DATA:  Left-sided chest pain EXAM: CHEST - 2 VIEW COMPARISON:  08/10/2016 FINDINGS: The heart size and mediastinal contours are within normal limits. Both lungs are clear. The visualized skeletal structures are unremarkable. IMPRESSION: No active cardiopulmonary disease. Electronically Signed   By: Elige KoHetal  Patel   On: 08/16/2018 09:14   Vas [place] Lower Extremity Venous (dvt) (only [facility])  Result Date: 08/16/2018  Lower Venous Study Indications: Pain, bruising, and SOB.  Risk Factors: D dimer Normal. Comparison Study: no prior Performing Technologist: Jeb LeveringJill Parker RDMS, RVT  Examination Guidelines: A complete evaluation includes B-mode imaging, spectral Doppler, color Doppler, and power Doppler as needed of all accessible portions of each vessel. Bilateral testing is  considered an integral part of a complete examination. Limited examinations for reoccurring indications may be performed as noted.  +---------+---------------+---------+-----------+----------+-------+  RIGHT     Compressibility Phasicity Spontaneity Properties Summary  +---------+---------------+---------+-----------+----------+-------+  CFV       Full            Yes       Yes                             +---------+---------------+---------+-----------+----------+-------+  SFJ       Full                                                      +---------+---------------+---------+-----------+----------+-------+  FV Prox   Full                                                      +---------+---------------+---------+-----------+----------+-------+  FV Mid    Full                                                      +---------+---------------+---------+-----------+----------+-------+  FV Distal Full                                                      +---------+---------------+---------+-----------+----------+-------+  PFV       Full                                                      +---------+---------------+---------+-----------+----------+-------+  POP       Full            Yes       Yes                             +---------+---------------+---------+-----------+----------+-------+  PTV       Full                                                      +---------+---------------+---------+-----------+----------+-------+  PERO      Full                                                      +---------+---------------+---------+-----------+----------+-------+   +---------+---------------+---------+-----------+----------+-------+  LEFT      Compressibility Phasicity Spontaneity Properties Summary  +---------+---------------+---------+-----------+----------+-------+  CFV       Full            Yes       Yes                             +---------+---------------+---------+-----------+----------+-------+  SFJ       Full                                                       +---------+---------------+---------+-----------+----------+-------+  FV Prox   Full                                                      +---------+---------------+---------+-----------+----------+-------+  FV Mid    Full                                                      +---------+---------------+---------+-----------+----------+-------+  FV Distal Full                                                      +---------+---------------+---------+-----------+----------+-------+  PFV       Full                                                      +---------+---------------+---------+-----------+----------+-------+  POP       Full            Yes       Yes                             +---------+---------------+---------+-----------+----------+-------+  PTV       Full                                                      +---------+---------------+---------+-----------+----------+-------+  PERO      Full                                                      +---------+---------------+---------+-----------+----------+-------+     Summary: Right: There is no evidence of deep vein thrombosis in the lower extremity. No cystic structure found in the popliteal fossa. Left: There is no evidence of deep vein thrombosis in the lower extremity. No cystic structure found in the popliteal fossa.  *See table(s) above for measurements and observations.    Preliminary     Procedures Procedures (including critical care time)  Medications Ordered in ED Medications  sodium chloride 0.9 % bolus 500 mL (0 mLs Intravenous Stopped 08/16/18 1201)     Initial Impression / Assessment and Plan / ED Course  I have reviewed the triage vital signs and the nursing notes.  Pertinent labs & imaging results that were available during my care of the patient were reviewed by me and considered in my medical decision making (see chart for details).    Initial physical examination reveals a  well-appearing 48 year old female in no acute distress.  She has consistently reproducible tenderness to light palpation of the left chest wall and pain worsened with movement of right arm.  Heart regular rate and rhythm without murmur, lungs clear to auscultation bilaterally, abdomen soft nontender without peritoneal signs or distention, extremities neurovascularly intact x4 with appropriate tactile temperatures and without swelling.  Patient does report some left calf tenderness greater than right.  She does have history of smoking no swelling or color change.  Will obtain d-dimer and ultrasound bilateral lower extremities today in addition to chest pain work-up. - HS Troponin: 3 D-dimer negative CBC within normal limits BMP nonacute Urine pregnancy test negative Chest x-ray: IMPRESSION: No active cardiopulmonary disease.  EKG:  Sinus rhythm No significant change since last tracing Confirmed by Raeford RazorKohut, Stephen   US BLE: Summary: Right: There is no evidence of deep vein thrombosis in the lower extremity. No cystic structure found in the popliteal fossa. Left: There is no evidence of deep vein thrombosis in the lower extremity. No cystic structure found in the popliteal fossa.  - As patient with onset of pain less than 3 hours prior to arrival will obtain delta troponin. - Repeat high-sensitivity troponin of 4.         Delta 1 ng/L  Heart score 2 - Based on history and physical examination as well as work-up today suspect patient with musculoskeletal etiology of her chest pain.  Will treat with OTC anti-inflammatories and muscle relaxers, warm compresses and PCP follow-up.  I have counseled patient on smoking cessation and she plans to quit, she will discuss further with her primary care provider.  Robaxin 500 mg twice daily prescribed, patient has been counseled on precautions regarding muscle relaxers and states understanding.  Additionally naproxen 375 mg twice daily prescribed, she is without  history of CKD or gastric ulcer, advised to take with  food and increase water intake.  Chest pain is not likely of cardiac or pulmonary etiology d/t presentation, DDimer negative, VSS, no tracheal deviation, no JVD or new murmur, RRR, breath sounds equal bilaterally, EKG without acute abnormalities, negative troponin x2, and negative CXR.  Do not suspect ACS, dissection, PE or other acute etiologies of her pain today.  At this time there does not appear to be any evidence of an acute emergency medical condition and the patient appears stable for discharge with appropriate outpatient follow up. Diagnosis was discussed with patient who verbalizes understanding of care plan and is agreeable to discharge. I have discussed return precautions with patient who verbalizes understanding of return precautions. Patient encouraged to follow-up with their PCP. All questions answered. Patient has been discharged in good condition.  Patient's case discussed with Dr. Juleen ChinaKohut who agrees with plan to discharge with follow-up.   Note: Portions of this report may have been transcribed using voice recognition software. Every effort was made to ensure accuracy; however, inadvertent computerized transcription errors may still be present.  Final Clinical Impressions(s) / ED Diagnoses   Final diagnoses:  Atypical chest pain  Musculoskeletal chest pain    ED Discharge Orders         Ordered    naproxen (NAPROSYN) 375 MG tablet  2 times daily     08/16/18 1402    methocarbamol (ROBAXIN) 500 MG tablet  2 times daily     08/16/18 1402           Elizabeth PalauMorelli, Eleora Sutherland A, PA-C 08/16/18 1417    Raeford RazorKohut, Stephen, MD 08/17/18 [phone]

## 2018-08-16 NOTE — ED Triage Notes (Signed)
Pt reports left sided chest pain that radiates to her back with a sudden onset this morning. Pt reports that she is having pain when she takes a deep breath at present time. Report 8/10 sharp, constant pain at present. Pt does report some mild dizziness at present with no vomiting.

## 2018-08-16 NOTE — ED Triage Notes (Signed)
Pt in with L side cp that began 20 min PTA while driving to work. States sob began shortly after. C/o mild nausea and dizziness

## 2018-08-16 NOTE — Progress Notes (Signed)
LE venous duplex       has been completed. Preliminary results can be found under CV proc through chart review. Markia Kyer, BS, RDMS, RVT   

## 2018-08-16 NOTE — ED Notes (Signed)
Vascular at bedside

## 2018-08-16 NOTE — ED Notes (Signed)
Patient verbalizes understanding of discharge instructions. Opportunity for questioning and answers were provided. Armband removed by staff, pt discharged from ED home via POV.  

## 2019-11-08 ENCOUNTER — Emergency Department (HOSPITAL_COMMUNITY): Payer: Self-pay

## 2019-11-08 ENCOUNTER — Emergency Department (HOSPITAL_COMMUNITY)
Admission: EM | Admit: 2019-11-08 | Discharge: 2019-11-08 | Disposition: A | Discharge status: Home / Self Care | Payer: Self-pay | Attending: Emergency Medicine | Admitting: Emergency Medicine

## 2019-11-08 DIAGNOSIS — X500XXA Overexertion from strenuous movement or load, initial encounter: Secondary | ICD-10-CM | POA: Insufficient documentation

## 2019-11-08 DIAGNOSIS — Y93E5 Activity, floor mopping and cleaning: Secondary | ICD-10-CM | POA: Insufficient documentation

## 2019-11-08 DIAGNOSIS — S39012A Strain of muscle, fascia and tendon of lower back, initial encounter: Secondary | ICD-10-CM | POA: Insufficient documentation

## 2019-11-08 DIAGNOSIS — M25532 Pain in left wrist: Secondary | ICD-10-CM | POA: Insufficient documentation

## 2019-11-08 DIAGNOSIS — Z87891 Personal history of nicotine dependence: Secondary | ICD-10-CM | POA: Insufficient documentation

## 2019-11-08 DIAGNOSIS — Y92009 Unspecified place in unspecified non-institutional (private) residence as the place of occurrence of the external cause: Secondary | ICD-10-CM | POA: Insufficient documentation

## 2019-11-08 DIAGNOSIS — Z79899 Other long term (current) drug therapy: Secondary | ICD-10-CM | POA: Insufficient documentation

## 2019-11-08 MED ORDER — METHOCARBAMOL 500 MG PO TABS: 500.0000 mg | ORAL_TABLET | Freq: Three times a day (TID) | ORAL | 0 refills | Status: DC | PRN

## 2019-11-08 MED ORDER — ACETAMINOPHEN 500 MG PO TABS: 500.0000 mg | ORAL_TABLET | Freq: Four times a day (QID) | ORAL | 0 refills | Status: DC | PRN

## 2019-11-08 MED ORDER — OXYCODONE-ACETAMINOPHEN 5-325 MG PO TABS
1.0000 | ORAL_TABLET | Freq: Once | ORAL | Status: AC
  Administered 2019-11-08: 1 via ORAL
  Filled 2019-11-08: qty 1

## 2019-11-08 NOTE — Discharge Instructions (Signed)
Your aches and pains are likely due to arthritis.  Take Tylenol and Robaxin as needed for pain.  Try soaking your back and wrist in warm water with Epsom salt bath twice daily as needed for comfort.  You may also take over-the-counter ibuprofen if your pain is not adequately controlled with Tylenol.

## 2019-11-08 NOTE — ED Triage Notes (Signed)
Pt here from home with c/o left wrist pain and low back pain after moving some things around in her basement

## 2019-11-08 NOTE — ED Provider Notes (Signed)
[facility] EMERGENCY DEPARTMENT Provider Note   CSN: [id number] Arrival date & time: 11/08/19  2376     History No chief complaint on file.   Susan Wang is a 49 y.o. female.  The history is provided by the patient and medical records. No language interpreter was used.     49 year old female history of  osteoarthritis, DJD, DDD, GERD presenting for evaluation of back pain and wrist pain.  Patient reported she was cleaning out her basement yesterday.  After working in the basement, she developed pain to her back and her left wrist.  She described pain in her back as a stabbing burning sensation to her lower back radiates down towards her left leg worse with any kind of movement and moderate in severity.  She also report pain to her left wrist with stinging sensation towards third and fourth fingers worse with movement as well.  Rates both pain is moderate in severity.  No associated fever or chills no bowel bladder incontinence saddle anesthesia.  No specific treatment tried.  She does admit to having history of carpal tunnel syndrome.  She does not have an orthopedist.  No abdominal pain or chest pain.  Past Medical History:  Diagnosis Date  . Anxiety   . Arthritis   . Bronchitis   . Carpal tunnel syndrome   . Carpal tunnel syndrome   . Chest pain 09/2012  . DDD (degenerative disc disease)   . DJD (degenerative joint disease)   . Osteoarthritis    knees  . Plantar fasciitis   . Shortness of breath     Patient Active Problem List   Diagnosis Date Noted  . 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 Plantar fasciitis of left foot 11/01/2012  . Carpal tunnel syndrome, bilateral 11/01/2012  . Preventative health care 11/01/2012  . Palpitations 10/19/2012  . GERD (gastroesophageal reflux disease) 10/14/2012  . DJD (degenerative joint disease)   . Osteoarthritis     Past Surgical History:  Procedure Laterality Date  . CHOLECYSTECTOMY       OB  History   No obstetric history on file.     Family History  Problem Relation Age of Onset  . Diabetes Father   . Other Mother   . Healthy Sister   . Healthy Brother     Social History   Tobacco Use  . Smoking status: Former Smoker    Packs/day: 0.50    Years: 13.00    Pack years: 6.50    Types: Cigars, Cigarettes    Quit date: 05/14/2010    Years since quitting: 9.4  . Smokeless tobacco: Never Used  Substance Use Topics  . Alcohol use: No    Comment: occasionally  . Drug use: Yes    Types: Marijuana    Comment: occ    Home Medications Prior to Admission medications   Medication Sig Start Date End Date Taking? Authorizing Provider  albuterol (VENTOLIN HFA) 108 (90 Base) MCG/ACT inhaler Inhale 2 puffs into the lungs every 6 (six) hours as needed for wheezing or shortness of breath. 11/17/15   Henrietta Hoover, NP  benzonatate (TESSALON) 100 MG capsule Take 1 capsule (100 mg total) by mouth every 8 (eight) hours. 08/10/16   Robinson, [place] N, PA-C  methocarbamol (ROBAXIN) 500 MG tablet Take 1 tablet (500 mg total) by mouth 2 (two) times daily. 08/16/18   Harlene Salts A, PA-C  naproxen (NAPROSYN) 375 MG tablet Take 1 tablet (375 mg total)  by mouth 2 (two) times daily. 08/16/18   Harlene Salts A, PA-C  traMADol (ULTRAM) 50 MG tablet Take 1 tablet (50 mg total) by mouth every 6 (six) hours as needed. 07/20/17   Hedges, Tinnie Gens, PA-C    Allergies    Other  Review of Systems   Review of Systems  Constitutional: Negative for fever.  Musculoskeletal: Positive for arthralgias.  Skin: Negative for wound.  Neurological: Negative for numbness.    Physical Exam Updated Vital Signs BP (!) 150/79 (BP Location: Right Arm)   Pulse (!) 50   Temp 98.4 F (36.9 C) (Oral)   Resp 16   Ht 5' (1.524 m)   Wt 90.7 kg   SpO2 100%   BMI 39.06 kg/m   Physical Exam Vitals and nursing note reviewed.  Constitutional:      General: She is not in acute distress.    Appearance:  She is well-developed. She is obese.  HENT:     Head: Atraumatic.  Eyes:     Conjunctiva/sclera: Conjunctivae normal.  Abdominal:     Palpations: Abdomen is soft.     Tenderness: There is no abdominal tenderness.  Musculoskeletal:        General: Tenderness (Back: Tenderness to lumbar spine at the level of L4 and L5 on palpation with paracervical spinal tenderness but no overlying skin changes crepitus step-off or obvious deformity.  Back with full range of motion.  Negative straight leg raise.) present.     Cervical back: Neck supple.     Comments: Left wrist: Tenderness to the palpation of the wrist with decreased active range of motion but normal passive range of motion.  No overlying skin changes, radial pulse 2+, normal grip strength with brisk cap refill.  Left elbow nontender.  Skin:    Capillary Refill: Capillary refill takes less than 2 seconds.     Findings: No rash.  Neurological:     Mental Status: She is alert.  Psychiatric:        Mood and Affect: Mood normal.     ED Results / Procedures / Treatments   Labs (all labs ordered are listed, but only abnormal results are displayed) Labs Reviewed - No data to display  EKG None  Radiology DG Lumbar Spine Complete  Result Date: 11/08/2019 CLINICAL DATA:  Pain EXAM: LUMBAR SPINE - COMPLETE 4+ VIEW COMPARISON:  August 07, 2015 FINDINGS: There are five non-rib bearing lumbar-type vertebral bodies. There is normal alignment. There is no evidence for acute fracture or subluxation. Mild intervertebral disc space height loss at the thoracolumbar junction with endplate proliferative changes. There is facet arthropathy of the LEFT greater than RIGHT lower lumbar spine. Status post cholecystectomy. Pelvic phleboliths. IMPRESSION: No acute findings in the lumbar spine. Mild degenerative disc disease at the thoracolumbar junction. Facet arthropathy of the lower lumbar spine. Electronically Signed   By: Meda Klinefelter MD   On: 11/08/2019  09:10   DG Wrist Complete Left  Result Date: 11/08/2019 CLINICAL DATA:  Severe wrist pain after cleaning out face mint yesterday. EXAM: LEFT WRIST - COMPLETE 3+ VIEW COMPARISON:  None. FINDINGS: There is no evidence of fracture or dislocation. Negative for erosion or joint space narrowing. Degenerative spurring at the first [facility]. IMPRESSION: 1. No acute finding. 2. First [facility] osteoarthritis. Electronically Signed   By: Marnee Spring M.D.   On: 11/08/2019 09:11    Procedures Procedures (including critical care time)  Medications Ordered in ED Medications  oxyCODONE-acetaminophen (PERCOCET/ROXICET) 5-325 MG per  tablet 1 tablet (1 tablet Oral Given 11/08/19 0817)    ED Course  I have reviewed the triage vital signs and the nursing notes.  Pertinent labs & imaging results that were available during my care of the patient were reviewed by me and considered in my medical decision making (see chart for details).    MDM Rules/Calculators/A&P                          BP (!) 150/79 (BP Location: Right Arm)   Pulse (!) 50   Temp 98.4 F (36.9 C) (Oral)   Resp 16   Ht 5' (1.524 m)   Wt 90.7 kg   SpO2 100%   BMI 39.06 kg/m   Final Clinical Impression(s) / ED Diagnoses Final diagnoses:  Acute pain of left wrist  Strain of lumbar region, initial encounter    Rx / DC Orders ED Discharge Orders         Ordered    acetaminophen (TYLENOL) 500 MG tablet  Every 6 hours PRN        11/08/19 1148    methocarbamol (ROBAXIN) 500 MG tablet  Every 8 hours PRN        11/08/19 1148         11:47 AM Patient here with low back pain and left wrist pain after perform heavy lifting and working in her basement yesterday.  She does have reproducible pain to her low back and left wrist consistence with MSK pain.  No signs of infection.  She is neurovascularly intact.  Rice therapy discussed, orthopedic referral given as needed.   Fayrene Helper, PA-C 11/08/19 1152    Melene Plan, DO 11/08/19  1205

## 2019-12-09 ENCOUNTER — Emergency Department (HOSPITAL_COMMUNITY)
Admission: EM | Admit: 2019-12-09 | Discharge: 2019-12-09 | Disposition: A | Discharge status: Home / Self Care | Payer: Self-pay | Attending: Emergency Medicine | Admitting: Emergency Medicine

## 2019-12-09 ENCOUNTER — Other Ambulatory Visit: Payer: Self-pay

## 2019-12-09 ENCOUNTER — Encounter (HOSPITAL_COMMUNITY): Payer: Self-pay | Admitting: Emergency Medicine

## 2019-12-09 DIAGNOSIS — G8929 Other chronic pain: Secondary | ICD-10-CM | POA: Insufficient documentation

## 2019-12-09 DIAGNOSIS — M25561 Pain in right knee: Secondary | ICD-10-CM | POA: Insufficient documentation

## 2019-12-09 DIAGNOSIS — Z87891 Personal history of nicotine dependence: Secondary | ICD-10-CM | POA: Insufficient documentation

## 2019-12-09 MED ORDER — DICLOFENAC SODIUM 1 % EX GEL: 2.0000 g | Freq: Four times a day (QID) | CUTANEOUS | 0 refills | Status: DC

## 2019-12-09 MED ORDER — ACETAMINOPHEN 500 MG PO TABS: 500.0000 mg | ORAL_TABLET | Freq: Four times a day (QID) | ORAL | 0 refills | Status: DC | PRN

## 2019-12-09 MED ORDER — KETOROLAC TROMETHAMINE 60 MG/2ML IM SOLN
30.0000 mg | Freq: Once | INTRAMUSCULAR | Status: AC
  Administered 2019-12-09: 30 mg via INTRAMUSCULAR
  Filled 2019-12-09: qty 2

## 2019-12-09 NOTE — ED Triage Notes (Signed)
Pt reports chronic R knee pain that has been worse over the last week since starting a new job.

## 2019-12-09 NOTE — Discharge Instructions (Signed)
Please wear your knee sleeve and follow-up with the orthopedic doctor I referred you to.  I have given you the office phone number and you may follow-up with any of the orthopedist there.  Please use Tylenol or ibuprofen for pain.  You may use 600 mg ibuprofen every 6 hours or 1000 mg of Tylenol every 6 hours.  You may choose to alternate between the 2.  This would be most effective.  Not to exceed 4 g of Tylenol within 24 hours.  Not to exceed 3200 mg ibuprofen 24 hours.  I have also prescribed you diclofenac which is a topical medicine which will be applied to your knee 4 times daily.

## 2019-12-09 NOTE — Progress Notes (Signed)
Orthopedic Tech Progress Note Patient Details:  Susan Wang 23-Mar-1970 [id number]  Ortho Devices Type of Ortho Device: Knee Sleeve Ortho Device/Splint Location: Right Lower Extremity Ortho Device/Splint Interventions: Ordered, Application   Post Interventions Patient Tolerated: Well Instructions Provided: Adjustment of device, Poper ambulation with device, Care of device   Kesi Perrow P Harle Stanford 12/09/2019, 3:31 PM

## 2019-12-09 NOTE — ED Provider Notes (Signed)
[facility] EMERGENCY DEPARTMENT Provider Note   CSN: [id number] Arrival date & time: 12/09/19  1241     History Chief Complaint  Patient presents with  . Knee Pain    Susan Wang is a 49 y.o. female.  HPI  Patient is a 49 year old female with past medical history of bilateral carpal tunnel, severe osteoarthritis in bilateral knees, degenerative joint disease, bronchitis, anxiety, plantar fasciitis.  Patient is presented to the emergency department today for chronic right knee pain has been worse over the past week.  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  She has never been seen by an orthopedist in the past.  She states the pain is achy, worse with walking, somewhat better with Tylenol but states that it has not been working out and she stopped taking it.  She is taking no pain medicine at all for her symptoms and taking no medications at all or using any orthopedic devices.  She denies any mitigating factors.  No associated symptoms.  Denies any fevers or difficulty flexing or extending her knee.  States it is painful to walk however.    Past Medical History:  Diagnosis Date  . Anxiety   . Arthritis   . Bronchitis   . Carpal tunnel syndrome   . Carpal tunnel syndrome   . Chest pain 09/2012  . DDD (degenerative disc disease)   . DJD (degenerative joint disease)   . Osteoarthritis    knees  . Plantar fasciitis   . Shortness of breath     Patient Active Problem List   Diagnosis Date Noted  . 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 Plantar fasciitis of left foot 11/01/2012  . Carpal tunnel syndrome, bilateral 11/01/2012  . Preventative health care 11/01/2012  . Palpitations 10/19/2012  . GERD (gastroesophageal reflux disease) 10/14/2012  . DJD (degenerative joint disease)   .  Osteoarthritis     Past Surgical History:  Procedure Laterality Date  . CHOLECYSTECTOMY       OB History   No obstetric history on file.     Family History  Problem Relation Age of Onset  . Diabetes Father   . Other Mother   . Healthy Sister   . Healthy Brother     Social History   Tobacco Use  . Smoking status: Former Smoker    Packs/day: 0.50    Years: 13.00    Pack years: 6.50    Types: Cigars, Cigarettes    Quit date: 05/14/2010    Years since quitting: 9.5  . Smokeless tobacco: Never Used  Substance Use Topics  . Alcohol use: No    Comment: occasionally  . Drug use: Yes    Types: Marijuana    Comment: occ    Home Medications Prior to Admission medications   Medication Sig Start Date End Date Taking? Authorizing Provider  acetaminophen (TYLENOL) 500 MG tablet Take 1-2 tablets (500-1,000 mg total) by mouth every 6 (six) hours as needed. 12/09/19   Gailen Shelter, PA  albuterol (VENTOLIN HFA) 108 (90 Base) MCG/ACT inhaler Inhale 2 puffs into the lungs every 6 (six) hours as needed for wheezing or shortness of breath. 11/17/15   Henrietta Hoover, NP  benzonatate (TESSALON) 100 MG capsule Take 1 capsule (100 mg total) by mouth every 8 (eight) hours. 08/10/16  Robinson, [place] N, PA-C  diclofenac Sodium (VOLTAREN) 1 % GEL Apply 2 g topically 4 (four) times daily. 12/09/19   Gailen Shelter, PA  methocarbamol (ROBAXIN) 500 MG tablet Take 1 tablet (500 mg total) by mouth every 8 (eight) hours as needed for muscle spasms. 11/08/19   Fayrene Helper, PA-C  naproxen (NAPROSYN) 375 MG tablet Take 1 tablet (375 mg total) by mouth 2 (two) times daily. 08/16/18   Harlene Salts A, PA-C  traMADol (ULTRAM) 50 MG tablet Take 1 tablet (50 mg total) by mouth every 6 (six) hours as needed. 07/20/17   Hedges, Tinnie Gens, PA-C    Allergies    Other  Review of Systems   Review of Systems  Constitutional: Negative for fever.  HENT: Negative for congestion.   Respiratory: Negative  for shortness of breath.   Cardiovascular: Negative for chest pain.  Gastrointestinal: Negative for abdominal distention.  Musculoskeletal:       Knee pain right  Neurological: Negative for dizziness and headaches.    Physical Exam Updated Vital Signs BP (!) 138/108 (BP Location: Right Arm)   Pulse 60   Temp 98.3 F (36.8 C) (Oral)   Resp 14   Wt 99.8 kg   SpO2 99%   BMI 42.97 kg/m   Physical Exam Vitals and nursing note reviewed.  Constitutional:      General: She is not in acute distress.    Appearance: Normal appearance. She is not ill-appearing.  HENT:     Head: Normocephalic and atraumatic.  Eyes:     General: No scleral icterus.       Right eye: No discharge.        Left eye: No discharge.     Conjunctiva/sclera: Conjunctivae normal.  Cardiovascular:     Comments: Bilateral DP PT pulses 3+ and symmetric. Pulmonary:     Effort: Pulmonary effort is normal.     Breath sounds: No stridor.  Musculoskeletal:     Comments: Mild swelling and tenderness palpation diffusely across the entire right knee.  No redness or overlying skin changes.  No bruising.  No calf tenderness.  No lower extremity edema.  Full range of motion with passive range intact.  Patient is able to flex and extend her right ankle and her right knee.  It is painful for her to flex against resistance.  Skin:    General: Skin is warm and dry.     Capillary Refill: Capillary refill takes less than 2 seconds.  Neurological:     Mental Status: She is alert and oriented to person, place, and time. Mental status is at baseline.     Comments: Sensation intact in bilateral lower extremities.  Able to ambulate with some discomfort.     ED Results / Procedures / Treatments   Labs (all labs ordered are listed, but only abnormal results are displayed) Labs Reviewed - No data to display  EKG None  Radiology No results found.  Procedures Procedures (including critical care time)  Medications Ordered  in ED Medications  ketorolac (TORADOL) injection 30 mg (has no administration in time range)    ED Course  I have reviewed the triage vital signs and the nursing notes.  Pertinent labs & imaging results that were available during my care of the patient were reviewed by me and considered in my medical decision making (see chart for details).    MDM Rules/Calculators/A&P  Patient with chronic knee pain.  Is worse this past week because of her new job which requires her to ambulate more than prior job.  She has tried nothing prior to arrival.  She has not seen any specialist for this issue.  Her physical exam is notable for some diffuse knee pain.  She has had no trauma I have low suspicion for any fractures.  This is likely related to her confirmed osteoarthritis which she states is severe.  She has been having issues with it for years.  Physical exam is also reassuringly normal in regards to pulses and sensation.  We will urged with Voltaren gel and Tylenol she will follow-up with orthopedics.  She was also given a knee sleeve.  Final Clinical Impression(s) / ED Diagnoses Final diagnoses:  Chronic pain of right knee    Rx / DC Orders ED Discharge Orders         Ordered    diclofenac Sodium (VOLTAREN) 1 % GEL  4 times daily        12/09/19 1459    acetaminophen (TYLENOL) 500 MG tablet  Every 6 hours PRN        12/09/19 1459           Gailen Shelter, [place] 12/09/19 1505    Arby Barrette, MD 12/10/19 1404

## 2020-02-09 ENCOUNTER — Other Ambulatory Visit: Payer: Self-pay

## 2020-02-09 ENCOUNTER — Emergency Department (HOSPITAL_COMMUNITY): Payer: Self-pay

## 2020-02-09 ENCOUNTER — Emergency Department (HOSPITAL_COMMUNITY)
Admission: EM | Admit: 2020-02-09 | Discharge: 2020-02-10 | Disposition: A | Discharge status: Home / Self Care | Payer: Self-pay | Attending: Emergency Medicine | Admitting: Emergency Medicine

## 2020-02-09 DIAGNOSIS — Z5321 Procedure and treatment not carried out due to patient leaving prior to being seen by health care provider: Secondary | ICD-10-CM | POA: Insufficient documentation

## 2020-02-09 DIAGNOSIS — M79671 Pain in right foot: Secondary | ICD-10-CM | POA: Insufficient documentation

## 2020-02-09 MED ORDER — OXYCODONE-ACETAMINOPHEN 5-325 MG PO TABS
1.0000 | ORAL_TABLET | ORAL | Status: DC | PRN
  Administered 2020-02-09: 1 via ORAL
  Filled 2020-02-09: qty 1

## 2020-02-09 NOTE — ED Triage Notes (Signed)
Pt presents to ED POV. Pt c/o R foot pain x2w. Pt reports that pain has gotten increasingly worse. Pt reports she can bare partial weight. No open areas on foot

## 2020-02-10 NOTE — ED Notes (Signed)
Pt did not want to stay and left . 

## 2020-02-14 ENCOUNTER — Emergency Department (HOSPITAL_COMMUNITY): Payer: Self-pay

## 2020-02-14 ENCOUNTER — Emergency Department (HOSPITAL_COMMUNITY)
Admission: EM | Admit: 2020-02-14 | Discharge: 2020-02-14 | Disposition: A | Discharge status: Home / Self Care | Payer: Self-pay | Attending: Emergency Medicine | Admitting: Emergency Medicine

## 2020-02-14 ENCOUNTER — Encounter (HOSPITAL_COMMUNITY): Payer: Self-pay | Admitting: *Deleted

## 2020-02-14 DIAGNOSIS — M7731 Calcaneal spur, right foot: Secondary | ICD-10-CM | POA: Insufficient documentation

## 2020-02-14 DIAGNOSIS — Z79899 Other long term (current) drug therapy: Secondary | ICD-10-CM | POA: Insufficient documentation

## 2020-02-14 DIAGNOSIS — Z87891 Personal history of nicotine dependence: Secondary | ICD-10-CM | POA: Insufficient documentation

## 2020-02-14 DIAGNOSIS — M79671 Pain in right foot: Secondary | ICD-10-CM | POA: Insufficient documentation

## 2020-02-14 MED ORDER — NAPROXEN 375 MG PO TABS: 375.0000 mg | ORAL_TABLET | Freq: Two times a day (BID) | ORAL | 0 refills | Status: DC

## 2020-02-14 MED ORDER — HYDROCODONE-ACETAMINOPHEN 5-325 MG PO TABS: 1.0000 | ORAL_TABLET | ORAL | 0 refills | Status: DC | PRN

## 2020-02-14 MED ORDER — HYDROCODONE-ACETAMINOPHEN 5-325 MG PO TABS
1.0000 | ORAL_TABLET | Freq: Once | ORAL | Status: AC
  Administered 2020-02-14: 1 via ORAL
  Filled 2020-02-14: qty 1

## 2020-02-14 NOTE — Discharge Instructions (Addendum)
You are being prescribed Naproxen. Do not take Ibuprofen/Advil/Aleve/Motrin/Goody Powders/BC powders/Meloxicam/Diclofenac/Indomethacin and other Nonsteroidal anti-inflammatory medications   

## 2020-02-14 NOTE — ED Triage Notes (Signed)
Pt complains of right heel pain x 1 month, worse for the past week. She has been unable to bear weight. No injury. She feels like something is in her heel. Pain radiates to knee and back.

## 2020-02-14 NOTE — ED Notes (Signed)
Pt advised screeners she was "stepping outside". Pt was visualized by this writer ambulating out of the ED w/out assistance.

## 2020-02-14 NOTE — ED Provider Notes (Signed)
[facility]-EMERGENCY DEPT Provider Note   CSN: [id number] Arrival date & time: 02/14/20  1436     History Chief Complaint  Patient presents with   Foot Pain    Susan Wang is a 49 y.o. female.  HPI 49 year old female presents with atraumatic right foot pain.  It hurts in her heel.  Feels like there is something there and she wonders if she has a bone spur.  Foot has been swollen and there is pain in her calf as well.  No trauma or puncture wound.  No fevers.  She is unable to walk on it due to the pain it induces.   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     Family History  Problem Relation Age of Onset   Diabetes Father    Other Mother    Healthy Sister    Healthy Brother     Social History   Tobacco Use   Smoking status: Former Smoker    Packs/day: 0.50    Years: 13.00    Pack years: 6.50    Types: Cigars, Cigarettes    Quit date: 05/14/2010    Years since quitting: 9.7   Smokeless tobacco: Never Used  Substance Use Topics   Alcohol use: No    Comment: occasionally   Drug use: Yes    Types: Marijuana    Comment: occ    Home Medications Prior to Admission  medications   Medication Sig Start Date End Date Taking? Authorizing Provider  acetaminophen (TYLENOL) 500 MG tablet Take 1-2 tablets (500-1,000 mg total) by mouth every 6 (six) hours as needed. 12/09/19   Gailen Shelter, PA  albuterol (VENTOLIN HFA) 108 (90 Base) MCG/ACT inhaler Inhale 2 puffs into the lungs every 6 (six) hours as needed for wheezing or shortness of breath. 11/17/15   Henrietta Hoover, NP  benzonatate (TESSALON) 100 MG capsule Take 1 capsule (100 mg total) by mouth every 8 (eight) hours. 08/10/16   Robinson, [place] N, PA-C  diclofenac Sodium (VOLTAREN) 1 % GEL Apply 2 g topically 4 (four) times daily. 12/09/19   Gailen Shelter, PA  HYDROcodone-acetaminophen (NORCO) 5-325 MG tablet Take 1 tablet by mouth every 4 (four) hours as needed. 02/14/20   Pricilla Loveless, MD  naproxen (NAPROSYN) 375 MG tablet Take 1 tablet (375 mg total) by mouth 2 (two) times daily with a meal. 02/14/20   Pricilla Loveless, MD    Allergies    Other  Review of Systems   Review of Systems  Constitutional: Negative for fever.  Musculoskeletal: Positive for arthralgias and joint swelling.  Skin: Negative for color change and wound.    Physical Exam Updated Vital Signs BP 136/79 (BP Location: Right Arm)    Pulse 62    Temp 98.5 F (36.9 C) (Oral)    Resp 18    SpO2 98%   Physical Exam Vitals and nursing note reviewed.  Constitutional:      Appearance: She is well-developed and well-nourished.  HENT:     Head: Normocephalic and atraumatic.     Right Ear: External ear normal.     Left Ear: External ear normal.     Nose: Nose normal.  Eyes:     General:        Right eye: No discharge.        Left eye: No discharge.  Cardiovascular:     Rate and Rhythm: Normal rate and regular rhythm.     Pulses:          Dorsalis pedis pulses are 2+ on the right side.  Pulmonary:     Effort: Pulmonary effort is normal.  Abdominal:     General: There is no distension.  Musculoskeletal:     Right lower  leg: No swelling or tenderness. No edema.     Right ankle: No tenderness. Normal range of motion.     Right Achilles Tendon: No tenderness or defects.     Right foot: Swelling and tenderness present. No deformity.     Comments: Point tenderness to plantar foot near the heel. No obvious cellulitis or fluctuance  Skin:    General: Skin is warm and dry.  Neurological:     Mental Status: She is alert.  Psychiatric:        Mood and Affect: Mood is not anxious.     ED Results / Procedures / Treatments   Labs (all labs ordered are listed, but only abnormal results are displayed) Labs Reviewed - No data to display  EKG None  Radiology DG Foot Complete Right  Result Date: 02/14/2020 CLINICAL DATA:  Atraumatic heel pain EXAM: RIGHT FOOT COMPLETE - 3+ VIEW COMPARISON:  Radiograph 02/09/2020 FINDINGS: No acute bony abnormality. Specifically, no fracture, subluxation, or dislocation within the limitations of a nonweightbearing exam. Normal bone mineralization. No suspicious osseous lesions. Bidirectional calcaneal spurs are present. Minimal degenerative changes elsewhere in the foot. The mid and hindfoot. Slightly diminished edematous changes. IMPRESSION: 1. No acute bony abnormality. 2. Bidirectional calcaneal spurs and degenerative changes, similar to prior. Electronically Signed   By: Kreg Shropshire M.D.   On: 02/14/2020 22:15    Procedures Procedures (including critical care time)  Medications Ordered in ED Medications  HYDROcodone-acetaminophen (NORCO/VICODIN) 5-325 MG per tablet 1 tablet (1 tablet Oral Given 02/14/20 2240)    ED Course  I have reviewed the triage vital signs and the nursing notes.  Pertinent labs & imaging results that were available during my care of the patient were reviewed by me and considered in my medical decision making (see chart for details).    MDM Rules/Calculators/A&P                          Patient has point tenderness to her plantar foot.  She has  a bone spur seen there, which could or could not be symptomatic.Marland Kitchen  Perhaps she has plantar fasciitis.  While she indicates the pain radiates up her calf, my suspicion that this is DVT is quite low.  There is no obvious infection on exam.  I have  personally reviewed her x-ray images.  Will put in postop shoe, give short course of pain meds and NSAIDs, and have her follow-up with orthopedics. Final Clinical Impression(s) / ED Diagnoses Final diagnoses:  Pain of right heel    Rx / DC Orders ED Discharge Orders         Ordered    HYDROcodone-acetaminophen (NORCO) 5-325 MG tablet  Every 4 hours PRN        02/14/20 2248    naproxen (NAPROSYN) 375 MG tablet  2 times daily with meals        02/14/20 2248           Pricilla Loveless, MD 02/14/20 2300

## 2020-05-10 ENCOUNTER — Other Ambulatory Visit: Payer: Self-pay

## 2020-05-10 ENCOUNTER — Ambulatory Visit (HOSPITAL_COMMUNITY)
Admission: RE | Admit: 2020-05-10 | Discharge: 2020-05-10 | Disposition: A | Discharge status: Home / Self Care | Payer: Self-pay | Source: Ambulatory Visit | Attending: Urgent Care | Admitting: Urgent Care

## 2020-05-10 ENCOUNTER — Encounter (HOSPITAL_COMMUNITY): Payer: Self-pay

## 2020-05-10 VITALS — BP 144/85 | HR 56 | Temp 98.9°F | Resp 18

## 2020-05-10 DIAGNOSIS — M7731 Calcaneal spur, right foot: Secondary | ICD-10-CM

## 2020-05-10 DIAGNOSIS — M79671 Pain in right foot: Secondary | ICD-10-CM

## 2020-05-10 DIAGNOSIS — M722 Plantar fascial fibromatosis: Secondary | ICD-10-CM

## 2020-05-10 MED ORDER — PREDNISONE 20 MG PO TABS: ORAL_TABLET | ORAL | 0 refills | Status: DC

## 2020-05-10 NOTE — ED Triage Notes (Signed)
Pt present right foot/heel pain. Pt state this being going on for a while and she cannot walk on the foot.  Pt states that she has a burning sensation in the heel of her foot.

## 2020-05-10 NOTE — ED Provider Notes (Signed)
[facility]   MRN: [id number] DOB: [birth year]  Subjective:   Susan Wang is a 50 y.o. female presenting for several year history of persistent and worsening right heel and foot pain over the plantar surface of her feet.  Patient has been told that she has plantar fasciitis, has had x-rays done of her foot and was told she has calcaneal spurs.  She has not seen a podiatrist.  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  Denies any particular falls, trauma.  No current facility-administered medications for this encounter.  Current Outpatient Medications:  .  acetaminophen (TYLENOL) 500 MG tablet, Take 1-2 tablets (500-1,000 mg total) by mouth every 6 (six) hours as needed., Disp: 30 tablet, Rfl: 0 .  albuterol (VENTOLIN HFA) 108 (90 Base) MCG/ACT inhaler, Inhale 2 puffs into the lungs every 6 (six) hours as needed for wheezing or shortness of breath., Disp: 1 Inhaler, Rfl: 3 .  benzonatate (TESSALON) 100 MG capsule, Take 1 capsule (100 mg total) by mouth every 8 (eight) hours., Disp: 21 capsule, Rfl: 0 .  diclofenac Sodium (VOLTAREN) 1 % GEL, Apply 2 g topically 4 (four) times daily., Disp: 350 g, Rfl: 0 .  HYDROcodone-acetaminophen (NORCO) 5-325 MG tablet, Take 1 tablet by mouth every 4 (four) hours as needed., Disp: 10 tablet, Rfl: 0 .  naproxen (NAPROSYN) 375 MG tablet, Take 1 tablet (375 mg total) by mouth 2 (two) times daily with a meal., Disp: 20 tablet, Rfl: 0   Allergies  Allergen Reactions  . Other Anaphylaxis    "Any melon"     Past Medical History:  Diagnosis Date  . Anxiety   . Arthritis   . Bronchitis   . Carpal tunnel syndrome   . Carpal tunnel syndrome   . Chest pain 09/2012  . DDD (degenerative disc disease)   . DJD (degenerative joint disease)   . Osteoarthritis    knees  . Plantar fasciitis   . Shortness of breath      Past Surgical History:  Procedure  Laterality Date  . CHOLECYSTECTOMY      Family History  Problem Relation Age of Onset  . Diabetes Father   . Other Mother   . Healthy Sister   . Healthy Brother     Social History   Tobacco Use  . Smoking status: Former Smoker    Packs/day: 0.50    Years: 13.00    Pack years: 6.50    Types: Cigars, Cigarettes    Quit date: 05/14/2010    Years since quitting: 9.9  . Smokeless tobacco: Never Used  Substance Use Topics  . Alcohol use: No    Comment: occasionally  . Drug use: Yes    Types: Marijuana    Comment: occ    ROS   Objective:   Vitals: BP (!) 144/85 (BP Location: Right Arm)   Pulse (!) 56   Temp 98.9 F (37.2 C) (Oral)   Resp 18   SpO2 97%   Physical Exam Constitutional:      General: She is not in acute distress.    Appearance: Normal appearance. She is well-developed. She is not ill-appearing, toxic-appearing or diaphoretic.  HENT:     Head: Normocephalic and atraumatic.     Nose: Nose normal.     Mouth/Throat:     Mouth: Mucous membranes are moist.     Pharynx: Oropharynx is clear.  Eyes:     General: No scleral icterus.    Extraocular Movements: Extraocular movements intact.     Pupils: Pupils are equal, round, and reactive to light.  Cardiovascular:     Rate and Rhythm: Normal rate.  Pulmonary:     Effort: Pulmonary effort is normal.  Musculoskeletal:     Comments: Right foot: No ecchymosis, bony deformity.  Patient has exquisite tenderness on the plantar surface of her heel extending along the plantar fascia and is worse with dorsiflexion of her toes.  No open wounds, erythema, warmth.  Skin:    General: Skin is warm and dry.  Neurological:     General: No focal deficit present.     Mental Status: She is alert and oriented to person, place, and time.  Psychiatric:        Mood and Affect: Mood normal.        Behavior: Behavior normal.        Thought Content: Thought content normal.        Judgment: Judgment normal.        Assessment and Plan :   PDMP not reviewed this encounter.  1. Foot pain, right   2. Plantar fasciitis of right foot   3. Calcaneal spur of foot, right     Counseled on general nature and management of plantar fasciitis.  Recommended she establish care with a podiatrist for more aggressive intervention.  For now, use prednisone course, use of ice water baths of the feet, shoe inserts. Counseled patient on potential for adverse effects with medications prescribed/recommended today, ER and return-to-clinic precautions discussed, patient verbalized understanding.    Wallis Bamberg, PA-C 05/10/20 1205

## 2020-05-10 NOTE — Discharge Instructions (Signed)
[facility] ([place]) Podiatrist in [place], [place] COVID-19 info: [email or web address] Get online care: [email or web address] Address: [address], [place], [place] [address] Phone: [phone] Appointments: [email or web address]   [facility], [place], [place] Doctor in [place], [place] Address: [address], [place], [place] [address] Phone: [phone]  

## 2020-08-15 ENCOUNTER — Ambulatory Visit: Payer: Self-pay | Attending: Internal Medicine | Admitting: Internal Medicine

## 2020-08-15 ENCOUNTER — Other Ambulatory Visit: Payer: Self-pay

## 2021-02-27 ENCOUNTER — Ambulatory Visit (HOSPITAL_COMMUNITY)
Admission: EM | Admit: 2021-02-27 | Discharge: 2021-02-27 | Disposition: A | Discharge status: Home / Self Care | Payer: Self-pay | Attending: Emergency Medicine | Admitting: Emergency Medicine

## 2021-02-27 ENCOUNTER — Other Ambulatory Visit: Payer: Self-pay

## 2021-02-27 DIAGNOSIS — M542 Cervicalgia: Secondary | ICD-10-CM

## 2021-02-27 MED ORDER — KETOROLAC TROMETHAMINE 30 MG/ML IJ SOLN
30.0000 mg | Freq: Once | INTRAMUSCULAR | Status: AC
  Administered 2021-02-27: 30 mg via INTRAMUSCULAR

## 2021-02-27 MED ORDER — CYCLOBENZAPRINE HCL 10 MG PO TABS: 10.0000 mg | ORAL_TABLET | Freq: Two times a day (BID) | ORAL | 0 refills | Status: DC | PRN

## 2021-02-27 MED ORDER — KETOROLAC TROMETHAMINE 30 MG/ML IJ SOLN
INTRAMUSCULAR | Status: AC
  Filled 2021-02-27: qty 1

## 2021-02-27 MED ORDER — PREDNISONE 20 MG PO TABS: 40.0000 mg | ORAL_TABLET | Freq: Every day | ORAL | 0 refills | Status: DC

## 2021-02-27 NOTE — Discharge Instructions (Signed)
Your pain is most likely caused by irritation to the muscles or ligaments.   Beginning tomorrow take prednisone every morning with food for the next 5 days  You may use muscle relaxer twice a day as needed, be mindful this medication may make you drowsy, if this occurs you may use just at bedtime  You may use heating pad in 15 minute intervals as needed for additional comfort,  or you may find comfort in using ice in 10-15 minutes over affected area  Begin stretching affected area daily for 10 minutes as tolerated to further loosen muscles   When lying down place pillow underneath and between knees for support  Can try sleeping without pillow on firm mattress   Practice good posture: head back, shoulders back, chest forward, pelvis back and weight distributed evenly on both legs  If pain persist after recommended treatment or reoccurs if may be beneficial to follow up with orthopedic specialist for evaluation, this doctor specializes in the bones and can manage your symptoms long-term with options such as but not limited to imaging, medications or physical therapy

## 2021-02-27 NOTE — ED Provider Notes (Signed)
[facility]    CSN: [id number] Arrival date & time: 02/27/21  1841      History   Chief Complaint Chief Complaint  Patient presents with   Shoulder Pain    HPI Susan Wang is a 51 y.o. female.   Patient presents with left-sided neck pain that radiates into back and down left arm for 4 days.  Endorses numbness and tingling of the left hand and fingers.  Range of motion is intact but elicits pain.  Just prior to symptoms she was lifting heavy boxes at work.  Has attempted use of over-the-counter Tylenol and Advil which have not been helpful.  History of degenerative disc disease, carpal tunnel syndrome, anxiety, arthritis.     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      Home Medications    Prior to Admission medications   Medication Sig Start Date End Date Taking? Authorizing Provider  acetaminophen (TYLENOL) 500 MG tablet Take 1-2 tablets (500-1,000 mg total) by mouth every 6 (six) hours as needed. 12/09/19   Gailen Shelter, PA  albuterol (VENTOLIN HFA) 108 (90 Base) MCG/ACT inhaler Inhale 2 puffs into the lungs every 6 (six) hours as needed for wheezing or shortness of breath. 11/17/15   Henrietta Hoover, NP   benzonatate (TESSALON) 100 MG capsule Take 1 capsule (100 mg total) by mouth every 8 (eight) hours. 08/10/16   Robinson, [place] N, PA-C  diclofenac Sodium (VOLTAREN) 1 % GEL Apply 2 g topically 4 (four) times daily. 12/09/19   Gailen Shelter, PA  HYDROcodone-acetaminophen (NORCO) 5-325 MG tablet Take 1 tablet by mouth every 4 (four) hours as needed. 02/14/20   Pricilla Loveless, MD  naproxen (NAPROSYN) 375 MG tablet Take 1 tablet (375 mg total) by mouth 2 (two) times daily with a meal. 02/14/20   Pricilla Loveless, MD  predniSONE (DELTASONE) 20 MG tablet Take 2 tablets daily with breakfast. 05/10/20   Wallis Bamberg, PA-C    Family History Family History  Problem Relation Age of Onset   Diabetes Father    Other Mother    Healthy Sister    Healthy Brother     Social History Social History   Tobacco Use   Smoking status: Former    Packs/day: 0.50    Years: 13.00    Pack years: 6.50    Types: Cigars, Cigarettes    Quit date: 05/14/2010    Years since quitting: 10.8   Smokeless tobacco: Never  Substance Use Topics   Alcohol use: No    Comment: occasionally   Drug use: Yes    Types: Marijuana  Comment: occ     Allergies   Other   Review of Systems Review of Systems  Constitutional: Negative.   Respiratory: Negative.    Cardiovascular: Negative.   Gastrointestinal: Negative.   Musculoskeletal:  Positive for myalgias. Negative for arthralgias, back pain, gait problem, joint swelling, neck pain and neck stiffness.  Skin: Negative.   Neurological: Negative.     Physical Exam Triage Vital Signs ED Triage Vitals [02/27/21 1902]  Enc Vitals Group     BP 125/61     Pulse Rate (!) 55     Resp 16     Temp 98 F (36.7 C)     Temp Source Oral     SpO2 100 %     Weight      Height      Head Circumference      Peak Flow      Pain Score 8     Pain Loc      Pain Edu?      Excl. in GC?    No data found.  Updated Vital Signs BP 125/61 (BP Location: Left Arm)    Pulse  (!) 55    Temp 98 F (36.7 C) (Oral)    Resp 16    SpO2 100%   Visual Acuity Right Eye Distance:   Left Eye Distance:   Bilateral Distance:    Right Eye Near:   Left Eye Near:    Bilateral Near:     Physical Exam Constitutional:      Appearance: Normal appearance. She is normal weight.  Eyes:     Extraocular Movements: Extraocular movements intact.  Neck:     Trachea: Trachea normal.      Comments: Left sided  Pulmonary:     Effort: Pulmonary effort is normal.  Musculoskeletal:     Cervical back: No edema, erythema or crepitus. Pain with movement and muscular tenderness present.  Skin:    General: Skin is warm and dry.  Neurological:     Mental Status: She is alert and oriented to person, place, and time. Mental status is at baseline.  Psychiatric:        Mood and Affect: Mood normal.        Behavior: Behavior normal.     UC Treatments / Results  Labs (all labs ordered are listed, but only abnormal results are displayed) Labs Reviewed - No data to display  EKG   Radiology No results found.  Procedures Procedures (including critical care time)  Medications Ordered in UC Medications - No data to display  Initial Impression / Assessment and Plan / UC Course  I have reviewed the triage vital signs and the nursing notes.  Pertinent labs & imaging results that were available during my care of the patient were reviewed by me and considered in my medical decision making (see chart for details).  Acute neck pain  Will defer imaging at this time due to lack of direct injury, discussed with patient, in agreement with plan of care ,Toradol injection given in office, prescribed prednisone 5-day course and Flexeril for outpatient treatment, recommended RICE, heat in 15-minute intervals, pillows for support and daily stretching, recommended follow-up with orthopedic specialist for persistent or reoccurring symptoms Final Clinical Impressions(s) / UC Diagnoses   Final  diagnoses:  None   Discharge Instructions   None    ED Prescriptions   None    PDMP not reviewed this encounter.   Valinda Hoar, [place] 03/02/21 [phone]

## 2021-02-27 NOTE — ED Triage Notes (Signed)
Patient presents for left shoulder and back pain. Pt reported she lifts heavy boxes at work and this may have something to do with pain.

## 2021-05-24 ENCOUNTER — Other Ambulatory Visit: Payer: Self-pay

## 2021-05-24 ENCOUNTER — Encounter (HOSPITAL_COMMUNITY): Payer: Self-pay | Admitting: Emergency Medicine

## 2021-05-24 ENCOUNTER — Emergency Department (HOSPITAL_COMMUNITY)
Admission: EM | Admit: 2021-05-24 | Discharge: 2021-05-24 | Disposition: A | Discharge status: Home / Self Care | Payer: Self-pay | Attending: Emergency Medicine | Admitting: Emergency Medicine

## 2021-05-24 DIAGNOSIS — G5603 Carpal tunnel syndrome, bilateral upper limbs: Secondary | ICD-10-CM | POA: Insufficient documentation

## 2021-05-24 DIAGNOSIS — R030 Elevated blood-pressure reading, without diagnosis of hypertension: Secondary | ICD-10-CM | POA: Insufficient documentation

## 2021-05-24 MED ORDER — IBUPROFEN 800 MG PO TABS
800.0000 mg | ORAL_TABLET | Freq: Once | ORAL | Status: AC
Start: 2021-05-24 — End: 2021-05-24
  Administered 2021-05-24: 800 mg via ORAL
  Filled 2021-05-24: qty 1

## 2021-05-24 MED ORDER — CYCLOBENZAPRINE HCL 10 MG PO TABS: 10.0000 mg | ORAL_TABLET | Freq: Two times a day (BID) | ORAL | 0 refills | Status: DC | PRN

## 2021-05-24 MED ORDER — IBUPROFEN 600 MG PO TABS: 600.0000 mg | ORAL_TABLET | Freq: Four times a day (QID) | ORAL | 0 refills | Status: DC | PRN

## 2021-05-24 MED ORDER — DIAZEPAM 2 MG PO TABS
2.0000 mg | ORAL_TABLET | Freq: Once | ORAL | Status: AC
  Administered 2021-05-24: 2 mg via ORAL
  Filled 2021-05-24: qty 1

## 2021-05-24 NOTE — Discharge Instructions (Addendum)
Your symptom is likely due to aggravations of your carpal tunnel syndrome.  Continue to wear wrist brace that you have at home as needed for support.  You may take anti-inflammatory medication muscle relaxant for symptom control.  You may follow-up with hand specialist for further care.  Your blood pressure is elevated today, please have it rechecked by your primary care doctor at your earliest convenience.  Return if you have any concern. ?

## 2021-05-24 NOTE — ED Triage Notes (Signed)
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  Mild swelling to R hand. ?

## 2021-05-24 NOTE — ED Provider Notes (Signed)
?[facility] ?Provider Note ? ? ?CSN: [id number] ?Arrival date & time: 05/24/21  1827 ? ?  ? ?History ? ?Chief Complaint  ?Patient presents with  ? Hand Pain  ? Arm Pain  ? Shoulder Pain  ? Neck Pain  ? ? ?Susan Wang is a 51 y.o. female. ? ?The history is provided by the patient and medical records. No language interpreter was used.  ?Hand Pain ? ?Arm Pain ? ?Shoulder Pain ?Associated symptoms: neck pain   ?Neck Pain ? ?51 year old female with history of osteoarthritis, degenerative disc disease presenting complaining of arm pain.  Patient states she has had history of recurrent joint pain ongoing for years.  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  Pain is described as a tightness throbbing achy sensation persistent worsening with any kind of movement radiates towards her neck.  Despite using wrist brace, ibuprofen and over-the-counter medication at home her symptoms not adequately controlled.  She denies having any fever, chest pain, trouble breathing, abdominal pain, or rash.  She is right-hand dominant.  No known history of hypertension. ? ?Home Medications ?Prior to Admission medications   ?Medication Sig Start Date End Date Taking? Authorizing Provider  ?acetaminophen (TYLENOL) 500 MG tablet Take 1-2 tablets (500-1,000 mg total) by mouth every 6 (six) hours as needed. 12/09/19   Tedd Sias, PA  ?albuterol (VENTOLIN HFA) 108 (90 Base) MCG/ACT inhaler Inhale 2 puffs into the lungs every 6 (six) hours as needed for wheezing or shortness of breath. 11/17/15   Micheline Chapman, NP  ?benzonatate (TESSALON) 100 MG capsule Take 1 capsule (100 mg total) by mouth every 8 (eight) hours. 08/10/16   Robinson,  [place] N, PA-C  ?cyclobenzaprine (FLEXERIL) 10 MG tablet Take 1 tablet (10 mg total) by mouth 2 (two) times daily as needed for muscle spasms. 02/27/21   Hans Eden, NP  ?diclofenac Sodium (VOLTAREN) 1 % GEL Apply 2 g topically 4 (four) times daily. 12/09/19   Tedd Sias, PA  ?HYDROcodone-acetaminophen (NORCO) 5-325 MG tablet Take 1 tablet by mouth every 4 (four) hours as needed. 02/14/20   Sherwood Gambler, MD  ?naproxen (NAPROSYN) 375 MG tablet Take 1 tablet (375 mg total) by mouth 2 (two) times daily with a meal. 02/14/20   Sherwood Gambler, MD  ?predniSONE (DELTASONE) 20 MG tablet Take 2 tablets (40 mg total) by mouth daily. 02/27/21   Hans Eden, NP  ?   ? ?Allergies    ?Other   ? ?Review of Systems   ?Review of Systems  ?Musculoskeletal:  Positive for neck pain.  ?All other systems reviewed and are negative. ? ?Physical Exam ?Updated Vital Signs ?BP (!) 191/89   Pulse (!) 55   Temp 98.4 ?F (36.9 ?C) (Oral)   Resp 20   LMP 07/10/2017 (Approximate)   SpO2 97%  ?Physical Exam ?Vitals and nursing note reviewed.  ?Constitutional:   ?   General: She is not in acute distress. ?   Appearance: She is well-developed.  ?   Comments: Patient is standing upright, holding her right arm appears uncomfortable.  ?HENT:  ?   Head: Atraumatic.  ?Eyes:  ?   Conjunctiva/sclera: Conjunctivae normal.  ?  Neck:  ?   Comments: Cervical paraspinal muscle tenderness on palpation.  Neck with full range of motion ?Cardiovascular:  ?   Rate and Rhythm: Normal rate and regular rhythm.  ?   Pulses: Normal pulses.  ?   Heart sounds: Normal heart sounds.  ?Pulmonary:  ?   Effort: Pulmonary effort is normal.  ?Musculoskeletal:     ?   General: Tenderness (Right arm: Diffuse tenderness throughout entire arm most significant to the wrist and hand without any overlying skin changes or swelling noted.  Radial pulse 2+ with brisk cap refill.  Sensation intact throughout, no deformity) present.  ?   Cervical back: Normal range of  motion and neck supple.  ?   Comments: Left arm: Tenderness to palpation of left wrist and hand without any swelling or deformity noted.  Radial pulse 2+.  ?Skin: ?   Findings: No rash.  ?Neurological:  ?   Mental Status: She is alert.  ?Psychiatric:     ?   Mood and Affect: Mood normal.  ? ? ?ED Results / Procedures / Treatments   ?Labs ?(all labs ordered are listed, but only abnormal results are displayed) ?Labs Reviewed - No data to display ? ?EKG ?None ? ?Radiology ?No results found. ? ?Procedures ?Procedures  ? ? ?Medications Ordered in ED ?Medications  ?diazepam (VALIUM) tablet 2 mg (2 mg Oral Given 05/24/21 2059)  ?ibuprofen (ADVIL) tablet 800 mg (800 mg Oral Given 05/24/21 2059)  ? ? ?ED Course/ Medical Decision Making/ A&P ?  ?                        ?Medical Decision Making ?Risk ?Prescription drug management. ? ? ?BP (!) 191/89   Pulse (!) 55   Temp 98.4 ?F (36.9 ?C) (Oral)   Resp 20   LMP 07/10/2017 (Approximate)   SpO2 97%  ? ?8:57 PM ?This is a 51 year old female with significant history of osteoarthritis and degenerative disc disease who presents complaining of reproducible pain to both hands and wrist along with worsening right arm pain ongoing for several weeks.  She has had trouble with arthritis for many years and due to a recent switch in her job occupation which requires increasing repetitive movements she is here with worsening pain to her right dominant arm and hand.  I suspect this is likely related to aggravations of her carpal tunnel syndrome that she has had in the past.  I have low suspicion for stroke, fracture or dislocation or infectious source. ? ?And patient was given medication to help with the symptoms and on reexamination she reports feeling better.  She does have a wrist brace at home that I encouraged patient to use at night to help aid her discomfort.  Patient will be going home with muscle relaxant and anti-inflammatory medication as well as a work note to take a few days  off.  After discussion, I will provide patient with a referral to hand specialist for further managements of her recurrent carpal tunnel related discomfort. ? ?Patient was noted to have elevated blood pressure.  She does not have any known history of HTN.  I recommend pt to f/u closely with her PCP for BP recheck.   ? ? ? ? ? ? ? ?Final Clinical Impression(s) / ED Diagnoses ?Final diagnoses:  ?Bilateral carpal tunnel syndrome  ? ? ?Rx / DC Orders ?ED Discharge Orders   ? ?      Ordered  ?  ibuprofen (ADVIL) 600 MG tablet  Every 6 hours PRN       ? 05/24/21 2232  ?  cyclobenzaprine (FLEXERIL) 10 MG tablet  2 times daily PRN       ? 05/24/21 2232  ? ?  ?  ? ?  ? ? ?  ?Domenic Moras, PA-C ?05/24/21 2258 ? ?  ?Luna Fuse, MD ?06/01/21 1959 ? ?

## 2021-06-01 ENCOUNTER — Emergency Department (HOSPITAL_COMMUNITY): Payer: Self-pay

## 2021-06-01 ENCOUNTER — Encounter (HOSPITAL_COMMUNITY): Payer: Self-pay

## 2021-06-01 ENCOUNTER — Other Ambulatory Visit: Payer: Self-pay

## 2021-06-01 ENCOUNTER — Emergency Department (HOSPITAL_COMMUNITY)
Admission: EM | Admit: 2021-06-01 | Discharge: 2021-06-02 | Disposition: A | Discharge status: Home / Self Care | Payer: Self-pay | Attending: Student | Admitting: Student

## 2021-06-01 DIAGNOSIS — K551 Chronic vascular disorders of intestine: Secondary | ICD-10-CM

## 2021-06-01 DIAGNOSIS — M545 Low back pain, unspecified: Secondary | ICD-10-CM | POA: Insufficient documentation

## 2021-06-01 DIAGNOSIS — R079 Chest pain, unspecified: Secondary | ICD-10-CM | POA: Insufficient documentation

## 2021-06-01 DIAGNOSIS — R0602 Shortness of breath: Secondary | ICD-10-CM | POA: Insufficient documentation

## 2021-06-01 DIAGNOSIS — Z87891 Personal history of nicotine dependence: Secondary | ICD-10-CM | POA: Insufficient documentation

## 2021-06-01 DIAGNOSIS — X500XXA Overexertion from strenuous movement or load, initial encounter: Secondary | ICD-10-CM | POA: Insufficient documentation

## 2021-06-01 LAB — CBC WITH DIFFERENTIAL/PLATELET
Abs Immature Granulocytes: 0.01 10*3/uL (ref 0.00–0.07)
Basophils Absolute: 0.1 10*3/uL (ref 0.0–0.1)
Basophils Relative: 1 %
Eosinophils Absolute: 0.2 10*3/uL (ref 0.0–0.5)
Eosinophils Relative: 2 %
HCT: 44.5 % (ref 36.0–46.0)
Hemoglobin: 14.6 g/dL (ref 12.0–15.0)
Immature Granulocytes: 0 %
Lymphocytes Relative: 43 %
Lymphs Abs: 3.6 10*3/uL (ref 0.7–4.0)
MCH: 31.4 pg (ref 26.0–34.0)
MCHC: 32.8 g/dL (ref 30.0–36.0)
MCV: 95.7 fL (ref 80.0–100.0)
Monocytes Absolute: 0.7 10*3/uL (ref 0.1–1.0)
Monocytes Relative: 8 %
Neutro Abs: 3.8 10*3/uL (ref 1.7–7.7)
Neutrophils Relative %: 46 %
Platelets: 195 10*3/uL (ref 150–400)
RBC: 4.65 MIL/uL (ref 3.87–5.11)
RDW: 13.3 % (ref 11.5–15.5)
WBC: 8.3 10*3/uL (ref 4.0–10.5)
nRBC: 0 % (ref 0.0–0.2)

## 2021-06-01 LAB — URINALYSIS, ROUTINE W REFLEX MICROSCOPIC
Bilirubin Urine: NEGATIVE
Glucose, UA: NEGATIVE mg/dL
Ketones, ur: NEGATIVE mg/dL
Leukocytes,Ua: NEGATIVE
Nitrite: NEGATIVE
Protein, ur: NEGATIVE mg/dL
Specific Gravity, Urine: 1.024 (ref 1.005–1.030)
pH: 5 (ref 5.0–8.0)

## 2021-06-01 MED ORDER — HYDROCODONE-ACETAMINOPHEN 5-325 MG PO TABS
1.0000 | ORAL_TABLET | Freq: Once | ORAL | Status: AC
  Administered 2021-06-01: 1 via ORAL
  Filled 2021-06-01: qty 1

## 2021-06-01 NOTE — ED Provider Triage Note (Signed)
Emergency Medicine Provider Triage Evaluation Note ? ?Susan Wang , a 51 y.o. female  was evaluated in triage.  Pt complains of back pain and chest pain.  Had sudden onset right flank pain earlier today.  Initially thought was due to muscle spasm.  Took a muscle relaxer, anti-inflammatory without relief.  Now states she is having central chest pain and shortness of breath.  No numbness or weakness of legs.  No history of PE or DVT.  No prior history of AAA, dissection.  No dysuria, hematuria.  No recent falls or injuries. ? ?Review of Systems  ?Positive: Chest pain, flank pain, shortness of breath ?Negative: Numbness, weakness ? ?Physical Exam  ?BP (!) 151/90 (BP Location: Right Arm)   Pulse (!) 58   Temp 98.6 ?F (37 ?C) (Oral)   Resp (!) 22   LMP 07/10/2017 (Approximate)   SpO2 99%  ?Gen:   Awake, appears uncomfortable ?Resp:  Normal effort  ?MSK:   Moves extremities without difficulty, tenderness to right flank no overlying rash ?Other:  Intact bl pulses, ambulatory ? ?Medical Decision Making  ?Medically screening exam initiated at 11:11 PM.  Appropriate orders placed.  Susan Wang was informed that the remainder of the evaluation will be completed by another provider, this initial triage assessment does not replace that evaluation, and the importance of remaining in the ED until their evaluation is complete. ? ?CP, SOB, flank pain ?  ?Jacob Cicero A, PA-C ?06/01/21 2317 ? ?

## 2021-06-02 ENCOUNTER — Emergency Department (HOSPITAL_COMMUNITY): Payer: Self-pay

## 2021-06-02 LAB — COMPREHENSIVE METABOLIC PANEL
ALT: 11 U/L (ref 0–44)
AST: 16 U/L (ref 15–41)
Albumin: 4 g/dL (ref 3.5–5.0)
Alkaline Phosphatase: 58 U/L (ref 38–126)
Anion gap: 8 (ref 5–15)
BUN: 18 mg/dL (ref 6–20)
CO2: 23 mmol/L (ref 22–32)
Calcium: 8.9 mg/dL (ref 8.9–10.3)
Chloride: 108 mmol/L (ref 98–111)
Creatinine, Ser: 1.04 mg/dL — ABNORMAL HIGH (ref 0.44–1.00)
GFR, Estimated: >60 mL/min (ref >60)
Glucose, Bld: 91 mg/dL (ref 70–99)
Potassium: 4 mmol/L (ref 3.5–5.1)
Sodium: 139 mmol/L (ref 135–145)
Total Bilirubin: 0.9 mg/dL (ref 0.3–1.2)
Total Protein: 6.4 g/dL — ABNORMAL LOW (ref 6.5–8.1)

## 2021-06-02 LAB — LIPASE, BLOOD: Lipase: 35 U/L (ref 11–51)

## 2021-06-02 LAB — TROPONIN I (HIGH SENSITIVITY)
Troponin I (High Sensitivity): 6 ng/L (ref <18)
Troponin I (High Sensitivity): 6 ng/L (ref <18)

## 2021-06-02 MED ORDER — KETOROLAC TROMETHAMINE 15 MG/ML IJ SOLN
15.0000 mg | Freq: Once | INTRAMUSCULAR | Status: AC
  Administered 2021-06-02: 15 mg via INTRAVENOUS
  Filled 2021-06-02: qty 1

## 2021-06-02 MED ORDER — IOHEXOL 350 MG/ML SOLN
100.0000 mL | Freq: Once | INTRAVENOUS | Status: AC | PRN
  Administered 2021-06-02: 100 mL via INTRAVENOUS

## 2021-06-02 MED ORDER — LIDOCAINE 5 % EX PTCH
1.0000 | MEDICATED_PATCH | CUTANEOUS | Status: DC
  Administered 2021-06-02: 1 via TRANSDERMAL
  Filled 2021-06-02: qty 1

## 2021-06-02 MED ORDER — NAPROXEN 375 MG PO TABS: 375.0000 mg | ORAL_TABLET | Freq: Two times a day (BID) | ORAL | 0 refills | Status: DC

## 2021-06-02 MED ORDER — LIDOCAINE 4 % EX PTCH: 1.0000 | MEDICATED_PATCH | Freq: Two times a day (BID) | CUTANEOUS | 0 refills | Status: DC

## 2021-06-02 NOTE — ED Provider Notes (Signed)
?[facility] EMERGENCY DEPARTMENT ?Provider Note ? ?CSN: [id number] ?Arrival date & time: 06/01/21 2302 ? ?Chief Complaint(s) ?Shortness of Breath ? ?HPI ?Susan Wang is a 51 y.o. female with PMH osteoarthritis, degenerative disc disease who presents emergency department for evaluation of back and chest pain.  Patient states that she works in a hotel lifting heavy [employer] and initially thought that her pain might be due to a muscle spasm.  She took a muscle relaxer and NSAIDs at home without relief.  She arrives to the emergency department initially complaining of central chest pain with shortness of breath and back pain that she describes as "tearing".  She unfortunately waited in the emergency department lobby for greater than 9 hours and on my evaluation the chest pain and shortness of breath have resolved but her back pain remains.  Her pain is primarily right lumbar paraspinal with no associated numbness, tingling, weakness or other neurologic complaints. ? ? ?Shortness of Breath ?Associated symptoms: chest pain   ? ?Past Medical History ?Past Medical History:  ?Diagnosis Date  ? Anxiety   ? Arthritis   ? Bronchitis   ? Carpal tunnel syndrome   ? Carpal tunnel syndrome   ? Chest pain 09/2012  ? DDD (degenerative disc disease)   ? DJD (degenerative joint disease)   ? Osteoarthritis   ? knees  ? Plantar fasciitis   ? Shortness of breath   ? ?Patient Active Problem List  ? Diagnosis Date Noted  ? 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 Plantar fasciitis of left foot 11/01/2012  ? Carpal tunnel syndrome, bilateral 11/01/2012  ? Preventative health care 11/01/2012  ? Palpitations 10/19/2012  ? GERD (gastroesophageal reflux disease) 10/14/2012  ? DJD (degenerative joint disease)   ? Osteoarthritis   ? ?Home Medication(s) ?Prior to Admission medications   ?Medication Sig Start Date End Date Taking? Authorizing Provider  ?acetaminophen (TYLENOL) 500 MG tablet Take 1-2 tablets (500-1,000  mg total) by mouth every 6 (six) hours as needed. 12/09/19   Gailen Shelter, PA  ?albuterol (VENTOLIN HFA) 108 (90 Base) MCG/ACT inhaler Inhale 2 puffs into the lungs every 6 (six) hours as needed for wheezing or shortness of breath. 11/17/15   Henrietta Hoover, NP  ?benzonatate (TESSALON) 100 MG capsule Take 1 capsule (100 mg total) by mouth every 8 (eight) hours. 08/10/16   Robinson, [place] N, PA-C  ?cyclobenzaprine (FLEXERIL) 10 MG tablet Take 1 tablet (10 mg total) by mouth 2 (two) times daily as needed for muscle spasms. 05/24/21   Fayrene Helper, PA-C  ?diclofenac Sodium (VOLTAREN) 1 % GEL Apply 2 g topically 4 (four) times daily. 12/09/19   Gailen Shelter, PA  ?HYDROcodone-acetaminophen (NORCO) 5-325 MG tablet Take 1 tablet by mouth every 4 (four) hours as needed. 02/14/20   Pricilla Loveless, MD  ?ibuprofen (ADVIL) 600 MG tablet Take 1 tablet (600 mg total) by mouth every 6 (six) hours as needed. 05/24/21   Fayrene Helper, PA-C  ?naproxen (NAPROSYN) 375 MG tablet Take 1 tablet (375 mg total) by mouth 2 (two) times daily with a meal. 02/14/20   Pricilla Loveless, MD  ?predniSONE (DELTASONE) 20 MG tablet Take 2 tablets (40 mg total) by mouth daily. 02/27/21   Valinda Hoar, NP  ?                                                                                                                                  ?  Past Surgical History ?Past Surgical History:  ?Procedure Laterality Date  ? CHOLECYSTECTOMY    ? ?Family History ?Family History  ?Problem Relation Age of Onset  ? Diabetes Father   ? Other Mother   ? Healthy Sister   ? Healthy Brother   ? ? ?Social History ?Social History  ? ?Tobacco Use  ? Smoking status: Former  ?  Packs/day: 0.50  ?  Years: 13.00  ?  Pack years: 6.50  ?  Types: Cigars, Cigarettes  ?  Quit date: 05/14/2010  ?  Years since quitting: 11.0  ? Smokeless tobacco: Never  ?Substance Use Topics  ? Alcohol use: No  ?  Comment: occasionally  ? Drug use: Yes  ?  Types: Marijuana  ?  Comment: occ   ? ?Allergies ?Other ? ?Review of Systems ?Review of Systems  ?Respiratory:  Positive for shortness of breath.   ?Cardiovascular:  Positive for chest pain.  ?Musculoskeletal:  Positive for back pain.  ? ?Physical Exam ?Vital Signs  ?I have reviewed the triage vital signs ?BP (!) 157/91   Pulse (!) 58   Temp 97.8 ?F (36.6 ?C) (Oral)   Resp 11   LMP 07/10/2017 (Approximate)   SpO2 100%  ? ?Physical Exam ?Vitals and nursing note reviewed.  ?Constitutional:   ?   General: She is not in acute distress. ?   Appearance: She is well-developed.  ?HENT:  ?   Head: Normocephalic and atraumatic.  ?Eyes:  ?   Conjunctiva/sclera: Conjunctivae normal.  ?Cardiovascular:  ?   Rate and Rhythm: Normal rate and regular rhythm.  ?   Heart sounds: No murmur heard. ?Pulmonary:  ?   Effort: Pulmonary effort is normal. No respiratory distress.  ?   Breath sounds: Normal breath sounds.  ?Abdominal:  ?   Palpations: Abdomen is soft.  ?   Tenderness: There is no abdominal tenderness.  ?Musculoskeletal:     ?   General: Tenderness (Right paraspinal lumbar) present. No swelling.  ?   Cervical back: Neck supple.  ?Skin: ?   General: Skin is warm and dry.  ?   Capillary Refill: Capillary refill takes less than 2 seconds.  ?Neurological:  ?   Mental Status: She is alert.  ?Psychiatric:     ?   Mood and Affect: Mood normal.  ? ? ?ED Results and Treatments ?Labs ?(all labs ordered are listed, but only abnormal results are displayed) ?Labs Reviewed  ?COMPREHENSIVE METABOLIC PANEL - Abnormal; Notable for the following components:  ?    Result Value  ? Creatinine, Ser 1.04 (*)   ? Total Protein 6.4 (*)   ? All other components within normal limits  ?URINALYSIS, ROUTINE W REFLEX MICROSCOPIC - Abnormal; Notable for the following components:  ? APPearance HAZY (*)   ? Hgb urine dipstick SMALL (*)   ? Bacteria, UA RARE (*)   ? All other components within normal limits  ?CBC WITH DIFFERENTIAL/PLATELET  ?LIPASE, BLOOD  ?TROPONIN I (HIGH SENSITIVITY)   ?TROPONIN I (HIGH SENSITIVITY)  ?                                                                                                                       ? ?  Radiology ?DG Chest 2 View ? ?Result Date: 06/01/2021 ?CLINICAL DATA:  Chest pain. EXAM: CHEST - 2 VIEW COMPARISON:  August 16, 2018 FINDINGS: The heart size and mediastinal contours are within normal limits. Both lungs are clear. The visualized skeletal structures are unremarkable. IMPRESSION: No active cardiopulmonary disease. Electronically Signed   By: Aram Candela M.D.   On: 06/01/2021 23:56  ? ?CT Angio Chest/Abd/Pel for Dissection W and/or W/WO ? ?Result Date: 06/02/2021 ?CLINICAL DATA:  Chest or back pain, aortic dissection suspected. EXAM: CT ANGIOGRAPHY CHEST, ABDOMEN AND PELVIS TECHNIQUE: Non-contrast CT of the chest was initially obtained. Multidetector CT imaging through the chest, abdomen and pelvis was performed using the standard protocol during bolus administration of intravenous contrast. Multiplanar reconstructed images and MIPs were obtained and reviewed to evaluate the vascular anatomy. RADIATION DOSE REDUCTION: This exam was performed according to the departmental dose-optimization program which includes automated exposure control, adjustment of the mA and/or kV according to patient size and/or use of iterative reconstruction technique. CONTRAST:  OMNIPAQUE IOHEXOL 350 MG/ML SOLN COMPARISON:  None. FINDINGS: CTA CHEST FINDINGS Cardiovascular: The heart is normal in size and there is no pericardial effusion. No aortic aneurysm or dissection is seen. Pulmonary trunk is normal in caliber. Mediastinum/Nodes: No enlarged mediastinal, hilar, or axillary lymph nodes. Thyroid gland, trachea, and esophagus demonstrate no significant findings. Lungs/Pleura: No consolidation, effusion, or pneumothorax. A few scattered pulmonary nodules are noted bilaterally, the largest in the right lower lobe measuring 4 mm, axial image 91. Musculoskeletal:  There is a nodule in the medial right breast measuring 1.3 cm. Mild degenerative changes are present in the thoracic spine. No acute osseous abnormality. Review of the MIP images confirms the above findings. CTA ABDOMEN A

## 2021-06-02 NOTE — ED Triage Notes (Signed)
Pt complains of back pain and chest pain.  Had sudden onset right flank pain earlier today.  Initially thought was due to muscle spasm. ?

## 2021-06-02 NOTE — Discharge Instructions (Addendum)
Please take Naprosyn twice daily and 1000 mg of Tylenol 3 times daily for your back pain ?

## 2021-10-29 ENCOUNTER — Ambulatory Visit
Admission: EM | Admit: 2021-10-29 | Discharge: 2021-10-29 | Disposition: A | Discharge status: Home / Self Care | Payer: Self-pay | Attending: Physician Assistant | Admitting: Physician Assistant

## 2021-10-29 DIAGNOSIS — M25562 Pain in left knee: Secondary | ICD-10-CM

## 2021-10-29 MED ORDER — PREDNISONE 20 MG PO TABS: 40.0000 mg | ORAL_TABLET | Freq: Every day | ORAL | 0 refills | Status: AC

## 2021-10-29 NOTE — ED Triage Notes (Signed)
Pt c/o lt leg swelling and lt knee pain radiating down to toes. States pain started yesterday and worse today. Denies injury or taking meds for pain. States will need a work note.

## 2021-10-29 NOTE — ED Provider Notes (Signed)
[facility]    CSN: [id number] Arrival date & time: 10/29/21  1531      History   Chief Complaint Chief Complaint  Patient presents with   Knee Pain   Leg Swelling    HPI Susan Wang is a 51 y.o. female.   Patient here today for evaluation of left knee pain and swelling that started yesterday and is worse today. She reports pain radiates up and down entire leg. Most pain is present to anterior knee. She reports some intermittent numbness and tingling. Movement and weight bearing worsen pain. She has not taken any medication for symptoms. She requests work note. She does have history of arthritis in her knee.   The history is provided by the patient.  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      Home Medications    Prior to Admission medications   Medication Sig Start Date End Date Taking? Authorizing Provider  predniSONE (DELTASONE) 20 MG tablet Take 2 tablets (40 mg total) by mouth daily with breakfast for 5 days. 10/29/21 11/03/21 Yes Tomi Bamberger, PA-C  acetaminophen (TYLENOL) 500 MG tablet Take 1-2 tablets (500-1,000 mg total) by mouth every 6 (six) hours as needed.  12/09/19   Gailen Shelter, PA  albuterol (VENTOLIN HFA) 108 (90 Base) MCG/ACT inhaler Inhale 2 puffs into the lungs every 6 (six) hours as needed for wheezing or shortness of breath. 11/17/15   Henrietta Hoover, NP  cyclobenzaprine (FLEXERIL) 10 MG tablet Take 1 tablet (10 mg total) by mouth 2 (two) times daily as needed for muscle spasms. 05/24/21   Fayrene Helper, PA-C  diclofenac Sodium (VOLTAREN) 1 % GEL Apply 2 g topically 4 (four) times daily. 12/09/19   Gailen Shelter, PA  ibuprofen (ADVIL) 600 MG tablet Take 1 tablet (600 mg total) by mouth every 6 (six) hours as needed. 05/24/21   Fayrene Helper, PA-C  Lidocaine (SALONPAS PAIN RELIEVING) 4 % PTCH Apply 1 each topically every 12 (twelve) hours. 06/02/21   Kommor, [place], MD  naproxen (NAPROSYN) 375 MG tablet Take 1 tablet (375 mg total) by mouth 2 (two) times daily. 06/02/21   Kommor, Wyn Forster, MD    Family History Family History  Problem Relation Age of Onset   Diabetes Father    Other Mother    Healthy Sister    Healthy Brother     Social History Social History   Tobacco Use   Smoking status: Former    Packs/day: 0.50    Years: 13.00    Total pack years:  6.50    Types: Cigars, Cigarettes    Quit date: 05/14/2010    Years since quitting: 11.4   Smokeless tobacco: Never  Substance Use Topics   Alcohol use: No    Comment: occasionally   Drug use: Yes    Types: Marijuana    Comment: occ     Allergies   Other   Review of Systems Review of Systems  Constitutional:  Negative for chills and fever.  Eyes:  Negative for discharge and redness.  Musculoskeletal:  Positive for arthralgias and joint swelling.  Neurological:  Positive for numbness.     Physical Exam Triage Vital Signs ED Triage Vitals  Enc Vitals Group     BP 10/29/21 1743 (!) 145/81     Pulse Rate 10/29/21 1743 64     Resp 10/29/21 1743 18     Temp 10/29/21 1743 98.1 F (36.7 C)     Temp Source 10/29/21 1743 Oral     SpO2 10/29/21 1743 96 %      Weight --      Height --      Head Circumference --      Peak Flow --      Pain Score 10/29/21 1744 9     Pain Loc --      Pain Edu? --      Excl. in GC? --    No data found.  Updated Vital Signs BP (!) 145/81 (BP Location: Left Arm)   Pulse 64   Temp 98.1 F (36.7 C) (Oral)   Resp 18   LMP 07/10/2017 (Approximate)   SpO2 96%      Physical Exam Vitals and nursing note reviewed.  Constitutional:      General: She is not in acute distress.    Appearance: Normal appearance. She is not ill-appearing.  HENT:     Head: Normocephalic and atraumatic.  Eyes:     Conjunctiva/sclera: Conjunctivae normal.  Cardiovascular:     Rate and Rhythm: Normal rate.  Pulmonary:     Effort: Pulmonary effort is normal.  Musculoskeletal:     Comments: Mild swelling appreciated to left knee. Decreased ROM of left knee due to pain, TTP to anterior knee diffusely  Neurological:     Mental Status: She is alert.  Psychiatric:        Mood and Affect: Mood normal.        Behavior: Behavior normal.        Thought Content: Thought content normal.      UC Treatments / Results  Labs (all labs ordered are listed, but only abnormal results are displayed) Labs Reviewed - No data to display  EKG   Radiology No results found.  Procedures Procedures (including critical care time)  Medications Ordered in UC Medications - No data to display  Initial Impression / Assessment and Plan / UC Course  I have reviewed the triage vital signs and the nursing notes.  Pertinent labs & imaging results that were available during my care of the patient were reviewed by me and considered in my medical decision making (see chart for details).    Suspect knee pain is related to known osteoarthritis. Will treat with steroid burst and recommended follow up with ortho if no gradual improvement or with any further concerns. Work note provided as requested.   Final Clinical Impressions(s) / UC Diagnoses   Final  diagnoses:  Acute pain of left knee   Discharge Instructions   None  ED Prescriptions     Medication Sig Dispense Auth. Provider   predniSONE (DELTASONE) 20 MG tablet Take 2 tablets (40 mg total) by mouth daily with breakfast for 5 days. 10 tablet Tomi Bamberger, PA-C      PDMP not reviewed this encounter.   Tomi Bamberger, PA-C 10/29/21 1759

## 2022-01-26 ENCOUNTER — Emergency Department (HOSPITAL_COMMUNITY)
Admission: EM | Admit: 2022-01-26 | Discharge: 2022-01-26 | Discharge status: Against Medical Advice | Payer: Self-pay | Attending: Emergency Medicine | Admitting: Emergency Medicine

## 2022-01-26 ENCOUNTER — Ambulatory Visit
Admission: EM | Admit: 2022-01-26 | Discharge: 2022-01-26 | Disposition: A | Discharge status: Home / Self Care | Payer: Self-pay | Attending: Physician Assistant | Admitting: Physician Assistant

## 2022-01-26 ENCOUNTER — Encounter: Payer: Self-pay | Admitting: Physician Assistant

## 2022-01-26 ENCOUNTER — Other Ambulatory Visit: Payer: Self-pay

## 2022-01-26 DIAGNOSIS — R200 Anesthesia of skin: Secondary | ICD-10-CM

## 2022-01-26 DIAGNOSIS — Z5321 Procedure and treatment not carried out due to patient leaving prior to being seen by health care provider: Secondary | ICD-10-CM | POA: Insufficient documentation

## 2022-01-26 DIAGNOSIS — R079 Chest pain, unspecified: Secondary | ICD-10-CM | POA: Insufficient documentation

## 2022-01-26 NOTE — ED Notes (Signed)
Pt called for triage. Pt could not be found.

## 2022-01-26 NOTE — ED Notes (Signed)
Pt called [facility] for triage. Pt could not be found.

## 2022-01-26 NOTE — ED Triage Notes (Addendum)
Pt presents to [facility] with co of left arm numbness for one week from neck to left two finger. Pt has attempted otc medication like lidocaine and icy hot. Pt reports 1 month ago she begain having arm pain on that arm with swelling in the upper arm but contuined on and now has progressed to numbness.  When asked about cp pt st yes midsternal comes and goes burning in nature. Pt has also attempted motrin 800 mg for symptoms.

## 2022-01-26 NOTE — ED Provider Notes (Signed)
[facility]    CSN: [id number] Arrival date & time: 01/26/22  1833      History   Chief Complaint Chief Complaint  Patient presents with   Numbness   Arm Pain    HPI Susan Wang is a 51 y.o. female.   Patient here today for evaluation of left arm numbness and pain.  She reports that she has had left arm pain for about a month but complete numbness just started over the last few days.  She also reports some substernal chest pain as well.    The history is provided by the patient.  Arm Pain Associated symptoms include chest pain. Pertinent negatives include no shortness of breath.    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      Home Medications    Prior to Admission medications   Medication Sig Start Date End Date Taking? Authorizing Provider  acetaminophen (TYLENOL) 500 MG tablet Take 1-2 tablets (500-1,000 mg total) by mouth every 6 (six) hours as needed. 12/09/19   Gailen Shelter, PA  albuterol (VENTOLIN HFA) 108 (90 Base) MCG/ACT inhaler Inhale 2 puffs into the lungs every 6 (six) hours as needed for wheezing or shortness of breath. 11/17/15   Henrietta Hoover, NP  cyclobenzaprine (FLEXERIL) 10 MG tablet Take  1 tablet (10 mg total) by mouth 2 (two) times daily as needed for muscle spasms. 05/24/21   Fayrene Helper, PA-C  diclofenac Sodium (VOLTAREN) 1 % GEL Apply 2 g topically 4 (four) times daily. 12/09/19   Gailen Shelter, PA  ibuprofen (ADVIL) 600 MG tablet Take 1 tablet (600 mg total) by mouth every 6 (six) hours as needed. 05/24/21   Fayrene Helper, PA-C  Lidocaine (SALONPAS PAIN RELIEVING) 4 % PTCH Apply 1 each topically every 12 (twelve) hours. 06/02/21   Kommor, [place], MD  naproxen (NAPROSYN) 375 MG tablet Take 1 tablet (375 mg total) by mouth 2 (two) times daily. 06/02/21   Kommor, Wyn Forster, MD    Family History Family History  Problem Relation Age of Onset   Diabetes Father    Other Mother    Healthy Sister    Healthy Brother     Social History Social History   Tobacco Use   Smoking status: Former    Packs/day: 0.50    Years: 13.00    Total pack years: 6.50    Types: Cigars, Cigarettes    Quit date: 05/14/2010    Years since quitting: 11.7   Smokeless tobacco: Never  Substance Use Topics   Alcohol use: No    Comment: occasionally   Drug use:  Yes    Types: Marijuana    Comment: occ     Allergies   Other   Review of Systems Review of Systems  Constitutional:  Negative for chills and fever.  Eyes:  Negative for discharge and redness.  Respiratory:  Negative for shortness of breath.   Cardiovascular:  Positive for chest pain.  Gastrointestinal:  Negative for nausea and vomiting.  Musculoskeletal:  Positive for arthralgias.  Neurological:  Positive for numbness.     Physical Exam Triage Vital Signs ED Triage Vitals  Enc Vitals Group     BP 01/26/22 1856 130/79     Pulse Rate 01/26/22 1856 63     Resp 01/26/22 1856 16     Temp 01/26/22 1856 97.9 F (36.6 C)     Temp Source 01/26/22 1856 Oral     SpO2 01/26/22 1856 98 %     Weight --      Height --      Head Circumference --      Peak Flow --      Pain Score 01/26/22 1853 7     Pain Loc --      Pain Edu? --       Excl. in GC? --    No data found.  Updated Vital Signs BP 130/79   Pulse 63   Temp 97.9 F (36.6 C) (Oral)   Resp 16   LMP 07/10/2017 (Approximate)   SpO2 98%     Physical Exam Vitals and nursing note reviewed.  Constitutional:      General: She is not in acute distress.    Appearance: Normal appearance. She is not ill-appearing or diaphoretic.  HENT:     Head: Normocephalic and atraumatic.  Eyes:     Conjunctiva/sclera: Conjunctivae normal.  Cardiovascular:     Rate and Rhythm: Normal rate and regular rhythm.  Pulmonary:     Effort: Pulmonary effort is normal. No respiratory distress.     Breath sounds: Normal breath sounds. No wheezing, rhonchi or rales.  Neurological:     Mental Status: She is alert.  Psychiatric:        Mood and Affect: Mood normal.        Behavior: Behavior normal.      UC Treatments / Results  Labs (all labs ordered are listed, but only abnormal results are displayed) Labs Reviewed - No data to display  EKG   Radiology No results found.  Procedures Procedures (including critical care time)  Medications Ordered in UC Medications - No data to display  Initial Impression / Assessment and Plan / UC Course  I have reviewed the triage vital signs and the nursing notes.  Pertinent labs & imaging results that were available during my care of the patient were reviewed by me and considered in my medical decision making (see chart for details).    Given presentation recommended further evaluation in the emergency room for cardiac rule out as well as workup to rule out other causes of numbness.  Patient does not have any other signs or symptoms of stroke and has normal vital signs and seems otherwise stable.  Agreeable to transfer via bus.    Final Clinical Impressions(s) / UC Diagnoses   Final diagnoses:  Left arm numbness  Chest pain, unspecified type   Discharge Instructions   None    ED Prescriptions   None    PDMP not  reviewed this encounter.   Tomi Bamberger, PA-C 01/26/22 1922

## 2022-01-27 ENCOUNTER — Ambulatory Visit
Admission: EM | Admit: 2022-01-27 | Discharge: 2022-01-27 | Disposition: A | Discharge status: Home / Self Care | Payer: Self-pay | Attending: Emergency Medicine | Admitting: Emergency Medicine

## 2022-01-27 VITALS — BP 143/83 | HR 55 | Temp 98.1°F | Resp 16

## 2022-01-27 DIAGNOSIS — M4722 Other spondylosis with radiculopathy, cervical region: Secondary | ICD-10-CM

## 2022-01-27 DIAGNOSIS — M4802 Spinal stenosis, cervical region: Secondary | ICD-10-CM

## 2022-01-27 MED ORDER — KETOROLAC TROMETHAMINE 30 MG/ML IJ SOLN
30.0000 mg | Freq: Once | INTRAMUSCULAR | Status: AC
  Administered 2022-01-27: 30 mg via INTRAMUSCULAR

## 2022-01-27 MED ORDER — ACETAMINOPHEN 500 MG PO TABS: 1000.0000 mg | ORAL_TABLET | Freq: Three times a day (TID) | ORAL | 0 refills | Status: AC

## 2022-01-27 MED ORDER — METHYLPREDNISOLONE 4 MG PO TBPK
ORAL_TABLET | ORAL | 0 refills | Status: DC
Start: 2022-01-27 — End: 2022-10-25

## 2022-01-27 MED ORDER — BACLOFEN 10 MG PO TABS: 10.0000 mg | ORAL_TABLET | Freq: Every day | ORAL | 0 refills | Status: DC

## 2022-01-27 NOTE — ED Provider Notes (Signed)
[facility]    CSN: [id number] Arrival date & time: 01/27/22  1210    HISTORY   Chief Complaint  Patient presents with   Arm Injury    My left arm is totally numb all the way to my thumb and forefinger. I feel intense tingling and I'm having chest pains. - Entered by patient   Numbness   HPI Susan Wang is a pleasant, 51 y.o. female who presents to urgent care today. Patient complains of numbness from her left shoulder down to her the tips of her left fingers for the past month.  Patient states she has been using topical lidocaine patches and IcyHot without any relief.  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  Patient states that turning her neck causes pain.  Patient also complains of pain in her left upper chest for the past 3 days without known injury.  Patient was seen at urgent care yesterday and advised to go to the emergency room for further evaluation.  EMR reviewed, patient did go to the emergency room room but left without being seen.  EMR further reviewed, patient has a history of cervical radiculopathy and degenerative arthritis in her cervical spine, last documented 2019 via CT scan.  Patient states she currently does not have a primary care provider and has never seen a spine specialist.  Patient has had multiple visits to urgent care in the emergency room for complaints of similar to the ones that she is having today.  Patient denies shortness of breath, headache, dizziness, syncope, vision changes.  The history is provided by the patient.   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    Home Medications    Prior to Admission medications   Not on File    Family History Family History  Problem Relation Age of Onset   Diabetes Father    Other Mother    Healthy Sister    Healthy Brother    Social History Social History   Tobacco Use   Smoking status: Former    Packs/day: 0.50    Years: 13.00    Total pack years: 6.50    Types: Cigars, Cigarettes    Quit date: 05/14/2010    Years since quitting: 11.7   Smokeless tobacco: Never  Substance Use Topics   Alcohol use: No    Comment: occasionally   Drug use: Yes    Types: Marijuana    Comment: occ   Allergies   Other  Review of Systems Review of Systems Pertinent findings revealed after performing a 14  point review of systems has been noted in the history of present illness.  Physical Exam Triage Vital Signs ED Triage Vitals  Enc Vitals Group     BP 12/19/20 0827 (!) 147/82     Pulse Rate 12/19/20 0827 72     Resp 12/19/20 0827 18     Temp 12/19/20 0827 98.3 F (36.8 C)     Temp Source 12/19/20 0827 Oral     SpO2 12/19/20 0827 98 %     Weight --      Height --      Head Circumference --      Peak Flow --      Pain Score 12/19/20 0826 5     Pain Loc --      Pain Edu? --      Excl. in GC? --    Updated Vital Signs BP (!) 143/83 (BP Location: Right Arm)   Pulse (!) 55   Temp 98.1 F (36.7 C) (Oral)   Resp 16   LMP 07/10/2017 (Approximate)   SpO2 97%   Physical Exam Vitals and nursing note reviewed.  Constitutional:      General: She is not in acute distress.    Appearance: Normal  appearance.  HENT:     Head: Normocephalic and atraumatic.  Eyes:     Pupils: Pupils are equal, round, and reactive to light.  Cardiovascular:     Rate and Rhythm: Normal rate and regular rhythm.  Pulmonary:     Effort: Pulmonary effort is normal.     Breath sounds: Normal breath sounds.  Musculoskeletal:     Cervical back: Neck supple. Spasms, tenderness, bony tenderness and crepitus present. No swelling, edema, deformity, erythema, signs of trauma, lacerations, rigidity or torticollis. Pain with movement present. Decreased range of motion.     Thoracic back: Normal.  Skin:    General: Skin is warm and dry.  Neurological:     General: No focal deficit present.     Mental Status: She is alert and oriented to person, place, and time. Mental status is at baseline.  Psychiatric:        Mood and Affect: Mood normal.        Behavior: Behavior normal.        Thought Content: Thought content normal.        Judgment: Judgment normal.     [facility] / Diagnostics / Procedures:     Radiology No results found.  Procedures Procedures (including critical care time) EKG  Pending results:  Labs Reviewed - No data to display  Medications Ordered in UC: Medications  ketorolac (TORADOL) 30 MG/ML injection 30 mg (has no administration in time range)    UC Diagnoses / Final Clinical Impressions(s)   I have reviewed the triage vital signs and the nursing notes.  Pertinent labs & imaging results that were available during my care of the patient were reviewed by me and considered in my medical decision making (see chart for details).    Final diagnoses:  Foraminal stenosis of cervical region  Cervical radiculopathy due to degenerative joint disease of spine  Osteoarthritis of spine with radiculopathy, cervical region   Patient was provided with an injection of ketorolac during their visit today for acute pain relief.  Patient was advised to:  Begin Medrol dose pack Take baclofen once  daily 1 hour prior to bedtime Begin acetaminophen 1000 mg 3 times daily on a scheduled basis. Apply ice pack to affected area 4 times  daily for 20 minutes each time Avoid stretching or strengthening exercises until pain is completely resolved Obtain a primary care provider, PCP request is entered into her chart. Consider physical therapy, chiropractic care, orthopedic follow-up  Return precautions advised  ED Prescriptions     Medication Sig Dispense Auth. Provider   methylPREDNISolone (MEDROL DOSEPAK) 4 MG TBPK tablet Take 24 mg on day 1, 20 mg on day 2, 16 mg on day 3, 12 mg on day 4, 8 mg on day 5, 4 mg on day 6.  Take all tablets in each row at once, do not spread tablets out throughout the day. 21 tablet Theadora RamaMorgan, Shavette Shoaff Scales, PA-C   baclofen (LIORESAL) 10 MG tablet Take 1 tablet (10 mg total) by mouth at bedtime for 7 days. 7 tablet Theadora RamaMorgan, Dashea Mcmullan Scales, PA-C   acetaminophen (TYLENOL) 500 MG tablet Take 2 tablets (1,000 mg total) by mouth every 8 (eight) hours. 180 tablet Theadora RamaMorgan, Aaleigha Bozza Scales, PA-C      PDMP not reviewed this encounter.  Discharge Instructions:   Discharge Instructions      The mainstay of therapy for musculoskeletal pain is reduction of inflammation and relaxation of tension which is causing inflammation.  Keep in mind, pain always begets more pain.  To help you stay ahead of your pain and inflammation, I have provided the following regimen for you:   During your visit today, you received an injection of ketorolac, high-dose nonsteroidal anti-inflammatory pain medication that should significantly reduce your pain for the next 6 to 8 hours.    Please begin taking Tylenol 1000 mg 3 times daily (every 8 hours) as soon as you pick up your prescriptions from the pharmacy.   This evening, please begin taking baclofen 10 mg.  This is a highly effective muscle relaxer and antispasmodic which should continue to provide you with relaxation of your tense muscles,  allow you to sleep well and to keep your pain under control.   Tomorrow morning, please begin taking methylprednisolone.  Please take 1 full row tablets at once with your breakfast meal.  If you have had significant resolution of your pain before you finish the entire prescription, please feel free to discontinue.  It is not important to finish every ta dose blet.   During the day, please set aside time to apply ice to the affected area 4 times daily for 20 minutes each application.  This can be achieved by using a bag of frozen peas or corn, a Ziploc bag filled with ice and water, or Ziploc bag filled with half rubbing alcohol and half Dawn dish detergent, frozen into a slush.  Please be careful not to apply ice directly to your skin, always place a soft cloth between you and the ice pack.   Please avoid attempts to stretch or strengthen the affected area until you are feeling completely pain-free.  Attempts to do so will only prolong the healing process.   Please consider discussing referral to physical therapy with your primary care provider.  Physical therapist are very good at teasing out the underlying cause of acute lower back pain and helping with prevention of future recurrences.   I also recommend that you remain out of work for the next several days, I provided you with a note to return to work in 3 days.  If you feel that you need this time extended, please follow-up with your primary care provider or return to urgent care for reevaluation so that we can provide you  with a note for another 3 days.   Thank you for visiting urgent care today.  We appreciate the opportunity to participate in your care.       Disposition Upon Discharge:  Condition: stable for discharge home Home: take medications as prescribed; routine discharge instructions as discussed; follow up as advised.  Patient presented with an acute illness with associated systemic symptoms and significant discomfort  requiring urgent management. In my opinion, this is a condition that a prudent lay person (someone who possesses an average knowledge of health and medicine) may potentially expect to result in complications if not addressed urgently such as respiratory distress, impairment of bodily function or dysfunction of bodily organs.   Routine symptom specific, illness specific and/or disease specific instructions were discussed with the patient and/or caregiver at length.   As such, the patient has been evaluated and assessed, work-up was performed and treatment was provided in alignment with urgent care protocols and evidence based medicine.  Patient/parent/caregiver has been advised that the patient may require follow up for further testing and treatment if the symptoms continue in spite of treatment, as clinically indicated and appropriate.  Patient/parent/caregiver has been advised to report to orthopedic urgent care clinic or return to the [facility] or PCP in 3-5 days if no better; follow-up with orthopedics, PCP or the Emergency Department if new signs and symptoms develop or if the current signs or symptoms continue to change or worsen for further workup, evaluation and treatment as clinically indicated and appropriate  The patient will follow up with their current PCP if and as advised. If the patient does not currently have a PCP we will have assisted them in obtaining one.   The patient may need specialty follow up if the symptoms continue, in spite of conservative treatment and management, for further workup, evaluation, consultation and treatment as clinically indicated and appropriate.  Patient/parent/caregiver verbalized understanding and agreement of plan as discussed.  All questions were addressed during visit.  Please see discharge instructions below for further details of plan.  This office note has been dictated using Teaching laboratory technician.  Unfortunately, this method of dictation can  sometimes lead to typographical or grammatical errors.  I apologize for your inconvenience in advance if this occurs.  Please do not hesitate to reach out to me if clarification is needed.      Theadora Rama Scales, PA-C 01/27/22 1401

## 2022-01-27 NOTE — Discharge Instructions (Addendum)
The mainstay of therapy for musculoskeletal pain is reduction of inflammation and relaxation of tension which is causing inflammation.  Keep in mind, pain always begets more pain.  To help you stay ahead of your pain and inflammation, I have provided the following regimen for you:   During your visit today, you received an injection of ketorolac, high-dose nonsteroidal anti-inflammatory pain medication that should significantly reduce your pain for the next 6 to 8 hours.    Please begin taking Tylenol 1000 mg 3 times daily (every 8 hours) as soon as you pick up your prescriptions from the pharmacy.   This evening, please begin taking baclofen 10 mg.  This is a highly effective muscle relaxer and antispasmodic which should continue to provide you with relaxation of your tense muscles, allow you to sleep well and to keep your pain under control.   Tomorrow morning, please begin taking methylprednisolone.  Please take 1 full row tablets at once with your breakfast meal.  If you have had significant resolution of your pain before you finish the entire prescription, please feel free to discontinue.  It is not important to finish every ta dose blet.   During the day, please set aside time to apply ice to the affected area 4 times daily for 20 minutes each application.  This can be achieved by using a bag of frozen peas or corn, a Ziploc bag filled with ice and water, or Ziploc bag filled with half rubbing alcohol and half Dawn dish detergent, frozen into a slush.  Please be careful not to apply ice directly to your skin, always place a soft cloth between you and the ice pack.   Please avoid attempts to stretch or strengthen the affected area until you are feeling completely pain-free.  Attempts to do so will only prolong the healing process.   Please consider discussing referral to physical therapy with your primary care provider.  Physical therapist are very good at teasing out the underlying cause of acute  lower back pain and helping with prevention of future recurrences.   I also recommend that you remain out of work for the next several days, I provided you with a note to return to work in 3 days.  If you feel that you need this time extended, please follow-up with your primary care provider or return to urgent care for reevaluation so that we can provide you with a note for another 3 days.   Thank you for visiting urgent care today.  We appreciate the opportunity to participate in your care.

## 2022-01-27 NOTE — ED Triage Notes (Signed)
Pt c/o numbness to lt shoulder down to lt fingers for over a month. States used topical lidocaine and icey hot with no relief. 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 States pain on turning her neck. Pt c/o center chest pain x3 days with the numbness became worse. Denies known injury.

## 2022-01-28 ENCOUNTER — Encounter (HOSPITAL_COMMUNITY): Payer: Self-pay

## 2022-03-08 ENCOUNTER — Ambulatory Visit (INDEPENDENT_AMBULATORY_CARE_PROVIDER_SITE_OTHER): Payer: Self-pay | Admitting: Nurse Practitioner

## 2022-03-08 ENCOUNTER — Encounter: Payer: Self-pay | Admitting: Nurse Practitioner

## 2022-03-08 ENCOUNTER — Other Ambulatory Visit: Payer: Self-pay | Admitting: Nurse Practitioner

## 2022-03-08 VITALS — BP 136/63 | HR 105 | Temp 97.5°F | Ht 59.0 in | Wt 189.2 lb

## 2022-03-08 DIAGNOSIS — Z1211 Encounter for screening for malignant neoplasm of colon: Secondary | ICD-10-CM

## 2022-03-08 DIAGNOSIS — M4722 Other spondylosis with radiculopathy, cervical region: Secondary | ICD-10-CM | POA: Insufficient documentation

## 2022-03-08 DIAGNOSIS — K551 Chronic vascular disorders of intestine: Secondary | ICD-10-CM | POA: Insufficient documentation

## 2022-03-08 DIAGNOSIS — Z716 Tobacco abuse counseling: Secondary | ICD-10-CM | POA: Insufficient documentation

## 2022-03-08 DIAGNOSIS — Z131 Encounter for screening for diabetes mellitus: Secondary | ICD-10-CM

## 2022-03-08 DIAGNOSIS — G5603 Carpal tunnel syndrome, bilateral upper limbs: Secondary | ICD-10-CM

## 2022-03-08 DIAGNOSIS — Z0000 Encounter for general adult medical examination without abnormal findings: Secondary | ICD-10-CM

## 2022-03-08 MED ORDER — BACLOFEN 10 MG PO TABS: 10.0000 mg | ORAL_TABLET | Freq: Every evening | ORAL | 0 refills | Status: AC | PRN

## 2022-03-08 MED ORDER — IBUPROFEN 600 MG PO TABS: 600.0000 mg | ORAL_TABLET | Freq: Three times a day (TID) | ORAL | 0 refills | Status: DC | PRN

## 2022-03-08 NOTE — Progress Notes (Signed)
New Patient Office Visit  Subjective:  Patient ID: Susan Wang, female    DOB: 1970/05/11  Age: 52 y.o. MRN: [id number]  CC:  Chief Complaint  Patient presents with   Establish Care    HPI Susan Wang is a 52 y.o. female with past medical history of bilateral carpal tunnel  syndrome, degenerative disc disease, plantar fasciitis presents to establish care for her chronic medical condition.  Has no current PCP.  Cervical radiculopathy due to degenerative joint disease of spine/ bilateral carpal tunnel syndrome. Patient c/o chronic numbness from left shoulder down to her 1st 2 fingers.Bilateral  tingling aching pain, tightness in both wrists worse on her right wrist.  .She was evaluated at the urgent care about a month ago , was prescribed prednisone and baclofen. Stated that the medications helped relived her pain some. 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 She has been seen in the ED multiple times for her symptoms, was referred to otho but she did not follow up with them.She stated that she has learnt to live with her pain for years , currently has pain rated 8/10 takes tylenol as needed . She denies trauma, fever, chills, malaise chest pain, SOB, HA, dizziness, syncope.   Current smoker. Started smoking cigarettes at age 13, smokes one pack of cigarettes daily . She denies SOB, wheezing, cough.   Due for mammogram,  mammography scholarship form given to the patient. Number for the [facility] program given to see if she qualifies for free cervical cancer screening.   Past Medical History:  Diagnosis Date   Anxiety    Arthritis    Bronchitis    Carpal tunnel syndrome    Carpal tunnel syndrome    Chest pain 09/2012   DDD (degenerative disc disease)    DJD (degenerative joint disease)    Osteoarthritis    knees   Plantar fasciitis    Shortness of breath     Past Surgical History:  Procedure Laterality Date   CHOLECYSTECTOMY      Family  History  Problem Relation Age of Onset   Other Mother    Arthritis Mother    Diabetes Father    Healthy Sister    Healthy Brother     Social History   Socioeconomic History   Marital status: Married    Spouse name: Not on file   Number of children: 2   Years of education: Not on file   Highest education level: Not on file  Occupational History   Not on file  Tobacco Use   Smoking status: Every Day    Packs/day: 1.00    Years: 33.00    Total pack years: 33.00    Types: Cigars, Cigarettes    Last attempt to quit: 05/14/2010    Years since quitting: 11.8   Smokeless tobacco: Never  Substance and Sexual Activity   Alcohol use: No    Comment: occasionally   Drug use: Yes    Types: Marijuana    Comment: occ   Sexual activity: Yes    Birth control/protection: None  Other Topics Concern   Not on file  Social History Narrative   Lives with her [place] in [place]. She has a job.    Social Determinants of Health   Financial Resource Strain: Not on file  Food Insecurity: Not on file  Transportation Needs: Not on file  Physical Activity: Not on file  Stress: Not on file  Social Connections: Not on file  Intimate Partner Violence: Not on file    ROS Review of Systems  HENT:  Negative for congestion, dental problem, drooling and ear discharge.   Respiratory: Negative.  Negative for apnea, cough, choking, chest tightness, shortness of breath and stridor.   Cardiovascular:  Negative for chest pain, palpitations and leg swelling.  Gastrointestinal: Negative.  Negative for abdominal distention, abdominal pain and anal bleeding.  Musculoskeletal:  Positive for arthralgias and myalgias.  Neurological:  Positive for numbness. Negative for dizziness, tremors, seizures, syncope, facial asymmetry, speech difficulty, weakness, light-headedness and headaches.  Psychiatric/Behavioral: Negative.  Negative for agitation, behavioral problems, confusion and decreased concentration.      Objective:   Today's Vitals: BP 136/63   Pulse (!) 105   Temp (!) 97.5 F (36.4 C)   Ht 4\' 11"  (1.499 m)   Wt 189 lb 3.2 oz (85.8 kg)   LMP 07/10/2017 (Approximate)   SpO2 99%   BMI 38.21 kg/m   Physical Exam Constitutional:      General: She is not in acute distress.    Appearance: Normal appearance. She is obese. She is not ill-appearing, toxic-appearing or diaphoretic.  Eyes:     General: No scleral icterus.       Right eye: No discharge.        Left eye: No discharge.     Extraocular Movements: Extraocular movements intact.  Cardiovascular:     Rate and Rhythm: Normal rate and regular rhythm.     Pulses: Normal pulses.     Heart sounds: Normal heart sounds. No murmur heard.    No friction rub. No gallop.  Pulmonary:     Effort: Pulmonary effort is normal. No respiratory distress.     Breath sounds: Normal breath sounds. No stridor. No wheezing, rhonchi or rales.  Chest:     Chest wall: No tenderness.  Abdominal:     General: There is no distension.     Palpations: Abdomen is soft.     Tenderness: There is no abdominal tenderness.  Musculoskeletal:        General: Tenderness present. No deformity or signs of injury.     Right lower leg: No edema.     Left lower leg: No edema.     Comments: Tenderness on ROM of bilateral wrists and left shoulder. Skin warm and dry. No redness swelling noted. Has palpable radial pulses bilaterally.  Subjective numbness on first 2 fingers on the left hand   Skin:    General: Skin is warm and dry.     Capillary Refill: Capillary refill takes less than 2 seconds.  Neurological:     Mental Status: She is alert and oriented to person, place, and time.     Cranial Nerves: No cranial nerve deficit.     Sensory: Sensory deficit present.     Motor: No weakness.     Coordination: Coordination normal.     Gait: Gait normal.  Psychiatric:        Mood and Affect: Mood normal.        Behavior: Behavior normal.        Thought Content:  Thought content normal.        Judgment: Judgment normal.     Assessment & Plan:   Problem List Items Addressed This Visit       Nervous and Auditory   Carpal tunnel syndrome, bilateral - Primary    Worse on right hand  Please take ibuprofen 600mg  every  8 hours as needed for pain , alternate with tylenol 650mg   every 6 hours as needed for pain. Take baclofen 10mg  at bedtime daily as needed for muscle spasm.  Patient referred to othpedics      Relevant Medications   baclofen (LIORESAL) 10 MG tablet   Other Relevant Orders   CMP14+EGFR   Ambulatory referral to Orthopedic Surgery   Cervical radiculopathy due to degenerative joint disease of spine    Most recent CT in 2019 shows Degenerative changes  right-sided central stenosis and right foraminal narrowing at C4-5 and C5-6.    Please take ibuprofen 600mg  every 8 hours as needed for pain , alternate with tylenol 650mg   every 6 hours as needed for pain. Take baclofen 10mg  at bedtime daily as needed for muscle spasm.  Stretching exercises as tolerated.  Patient referred to orthopedics.        Relevant Medications   acetaminophen (TYLENOL) 500 MG tablet   ibuprofen (ADVIL) 600 MG tablet   baclofen (LIORESAL) 10 MG tablet   Other Relevant Orders   Ambulatory referral to Orthopedic Surgery     Other   Tobacco abuse counseling    Smokes about 1 pack/day  Asked about quitting: confirms that he/she currently smokes cigarettes Advise to quit smoking: Educated about QUITTING to reduce the risk of cancer, cardio and cerebrovascular disease. Assess willingness: Unwilling to quit at this time, but is working on cutting back. Assist with counseling and pharmacotherapy: Counseled for 5 minutes and literature provided. Arrange for follow up: follow up in 4 months and continue to offer help.       Other Visit Diagnoses     Screening for colon cancer       Relevant Orders   Cologuard   Screening for diabetes mellitus        Relevant Orders   Hemoglobin A1c   Health care maintenance       Relevant Orders   CMP14+EGFR   Hemoglobin A1c       Outpatient Encounter Medications as of 03/08/2022  Medication Sig   acetaminophen (TYLENOL) 500 MG tablet Take 500 mg by mouth every 6 (six) hours as needed.   ibuprofen (ADVIL) 600 MG tablet Take 1 tablet (600 mg total) by mouth every 8 (eight) hours as needed.   [DISCONTINUED] ibuprofen (ADVIL) 200 MG tablet Take 200 mg by mouth every 6 (six) hours as needed.   baclofen (LIORESAL) 10 MG tablet Take 1 tablet (10 mg total) by mouth at bedtime as needed for up to 14 days for muscle spasms.   methylPREDNISolone (MEDROL DOSEPAK) 4 MG TBPK tablet Take 24 mg on day 1, 20 mg on day 2, 16 mg on day 3, 12 mg on day 4, 8 mg on day 5, 4 mg on day 6.  Take all tablets in each row at once, do not spread tablets out throughout the day. (Patient not taking: Reported on 03/08/2022)   No facility-administered encounter medications on file as of 03/08/2022.    Follow-up: Return in about 4 months (around 07/07/2022) for Shoulder pain/carpal tunnle syndrome .   Renee Rival, FNP

## 2022-03-08 NOTE — Patient Instructions (Addendum)
Please take ibuprofen 600mg  every 8 hours as needed for pain , alternate with tylenol 650mg   every 6 hours as needed for pain. Take baclofen 10mg  at bedtime daily as needed for muscle spasm.   Please get your shingles vaccine, flu vaccine and TDAP vaccines at the pharmacy   It is important that you exercise regularly at least 30 minutes 5 times a week as tolerated  Think about what you will eat, plan ahead. Choose " clean, green, fresh or frozen" over canned, processed or packaged foods which are more sugary, salty and fatty. 70 to 75% of food eaten should be vegetables and fruit. Three meals at set times with snacks allowed between meals, but they must be fruit or vegetables. Aim to eat over a 12 hour period , example 7 am to 7 pm, and STOP after  your last meal of the day. Drink water,generally about 64 ounces per day, no other drink is as healthy. Fruit juice is best enjoyed in a healthy way, by EATING the fruit.  Thanks for choosing [facility] we consider it a privelige to serve you.

## 2022-03-08 NOTE — Assessment & Plan Note (Signed)
Worse on right hand  Please take ibuprofen 600mg  every 8 hours as needed for pain , alternate with tylenol 650mg   every 6 hours as needed for pain. Take baclofen 10mg  at bedtime daily as needed for muscle spasm.  Patient referred to othpedics

## 2022-03-08 NOTE — Assessment & Plan Note (Signed)
Smokes about 1pack/day  Asked about quitting: confirms that he/she currently smokes cigarettes Advise to quit smoking: Educated about QUITTING to reduce the risk of cancer, cardio and cerebrovascular disease. Assess willingness: Unwilling to quit at this time, but is working on cutting back. Assist with counseling and pharmacotherapy: Counseled for 5 minutes and literature provided. Arrange for follow up: follow up in 4 months and continue to offer help. 

## 2022-03-08 NOTE — Assessment & Plan Note (Addendum)
Most recent CT in 2019 shows Degenerative changes  right-sided central stenosis and right foraminal narrowing at C4-5 and C5-6.    Please take ibuprofen 600mg  every 8 hours as needed for pain , alternate with tylenol 650mg   every 6 hours as needed for pain. Take baclofen 10mg  at bedtime daily as needed for muscle spasm.  Stretching exercises as tolerated.  Patient referred to orthopedics.

## 2022-03-09 ENCOUNTER — Telehealth: Payer: Self-pay

## 2022-03-09 ENCOUNTER — Other Ambulatory Visit: Payer: Self-pay | Admitting: Nurse Practitioner

## 2022-03-09 DIAGNOSIS — M4722 Other spondylosis with radiculopathy, cervical region: Secondary | ICD-10-CM

## 2022-03-09 LAB — CMP14+EGFR
ALT: 14 IU/L (ref 0–32)
AST: 20 IU/L (ref 0–40)
Albumin/Globulin Ratio: 2 (ref 1.2–2.2)
Albumin: 4.6 g/dL (ref 3.8–4.9)
Alkaline Phosphatase: 78 IU/L (ref 44–121)
BUN/Creatinine Ratio: 23 (ref 9–23)
BUN: 20 mg/dL (ref 6–24)
Bilirubin Total: 0.4 mg/dL (ref 0.0–1.2)
CO2: 20 mmol/L (ref 20–29)
Calcium: 9.9 mg/dL (ref 8.7–10.2)
Chloride: 105 mmol/L (ref 96–106)
Creatinine, Ser: 0.88 mg/dL (ref 0.57–1.00)
Globulin, Total: 2.3 g/dL (ref 1.5–4.5)
Glucose: 86 mg/dL (ref 70–99)
Potassium: 4.7 mmol/L (ref 3.5–5.2)
Sodium: 141 mmol/L (ref 134–144)
Total Protein: 6.9 g/dL (ref 6.0–8.5)
eGFR: 79 mL/min/{1.73_m2} (ref >59)

## 2022-03-09 LAB — HEMOGLOBIN A1C
Est. average glucose Bld gHb Est-mCnc: 108 mg/dL
Hgb A1c MFr Bld: 5.4 % (ref 4.8–5.6)

## 2022-03-09 MED ORDER — GABAPENTIN 100 MG PO CAPS: 100.0000 mg | ORAL_CAPSULE | Freq: Three times a day (TID) | ORAL | 3 refills | Status: DC

## 2022-03-09 NOTE — Telephone Encounter (Signed)
Telephoned patient at mobile number. Left voice message with [facility] ([employer]) contact information. 

## 2022-03-09 NOTE — Progress Notes (Signed)
Labs are normal. I will do a trial of gabapentin 100mg  three times daily for her chronic  pain. Continue ibuprofen, tylenol  and baclofen as needed.  She should follow up with orthopedics as dicussed.

## 2022-07-07 ENCOUNTER — Ambulatory Visit: Payer: Self-pay | Admitting: Nurse Practitioner

## 2022-07-31 ENCOUNTER — Ambulatory Visit (HOSPITAL_COMMUNITY)
Admission: EM | Admit: 2022-07-31 | Discharge: 2022-07-31 | Disposition: A | Discharge status: Home / Self Care | Payer: 59 | Attending: Emergency Medicine | Admitting: Emergency Medicine

## 2022-07-31 ENCOUNTER — Encounter (HOSPITAL_COMMUNITY): Payer: Self-pay | Admitting: Emergency Medicine

## 2022-07-31 DIAGNOSIS — M10072 Idiopathic gout, left ankle and foot: Secondary | ICD-10-CM

## 2022-07-31 MED ORDER — PREDNISONE 50 MG PO TABS: 50.0000 mg | ORAL_TABLET | Freq: Every day | ORAL | 0 refills | Status: DC

## 2022-07-31 MED ORDER — TRAMADOL HCL 50 MG PO TABS: 50.0000 mg | ORAL_TABLET | Freq: Four times a day (QID) | ORAL | 0 refills | Status: DC | PRN

## 2022-07-31 MED ORDER — METHYLPREDNISOLONE ACETATE 80 MG/ML IJ SUSP
80.0000 mg | Freq: Once | INTRAMUSCULAR | Status: AC
  Administered 2022-07-31: 80 mg via INTRAMUSCULAR

## 2022-07-31 MED ORDER — METHYLPREDNISOLONE ACETATE 80 MG/ML IJ SUSP
INTRAMUSCULAR | Status: AC
  Filled 2022-07-31: qty 1

## 2022-07-31 NOTE — ED Provider Notes (Signed)
[facility]    CSN: [id number] Arrival date & time: 07/31/22  1740      History   Chief Complaint No chief complaint on file.   HPI Susan Wang is a 52 y.o. female.   Ration of left foot swelling, redness and pain beginning 1 day ago.  Painful to bear weight and has been limping.  Painful to wear shoes therefore using sandals as an alternative.  Patient endorses that it hurts for even her bedsheet to touch her foot.  Has attempted use of Tylenol and Motrin.  Symptoms have occurred before in the past   Vital signs for appointment below  141/90 - BP left brachial  63- heart rate  94- 02 sat 98.9- oral temp     Past Medical History:  Diagnosis Date   Anxiety    Arthritis    Bronchitis    Carpal tunnel syndrome    Carpal tunnel syndrome    Chest pain 09/2012   DDD (degenerative disc disease)    DJD (degenerative joint disease)    Osteoarthritis    knees   Plantar fasciitis    Shortness of breath     Patient Active Problem List   Diagnosis Date Noted   Cervical radiculopathy due to degenerative joint disease of spine 03/08/2022   Tobacco abuse counseling 03/08/2022   Superior mesenteric artery stenosis (HCC) 03/08/2022   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      Home Medications    Prior to Admission medications   Medication Sig Start Date End Date Taking? Authorizing Provider  acetaminophen (TYLENOL) 500 MG tablet Take 500 mg by mouth every 6 (six) hours as needed.    [provider]  gabapentin (NEURONTIN) 100 MG capsule Take 1 capsule (100 mg  total) by mouth 3 (three) times daily. 03/09/22   Donell Beers, FNP  ibuprofen (ADVIL) 600 MG tablet Take 1 tablet (600 mg total) by mouth every 8 (eight) hours as needed. 03/08/22   Paseda, Baird Kay, FNP  methylPREDNISolone (MEDROL DOSEPAK) 4 MG TBPK tablet Take 24 mg on day 1, 20 mg on day 2, 16 mg on day 3, 12 mg on day 4, 8 mg on day 5, 4 mg on day 6.  Take all tablets in each row at once, do not spread tablets out throughout the day. Patient not taking: Reported on 03/08/2022 01/27/22   Theadora Rama Scales, PA-C    Family History Family History  Problem Relation Age of Onset   Other Mother    Arthritis Mother    Diabetes Father    Healthy Sister    Healthy Brother     Social History Social History   Tobacco Use   Smoking status: Every Day    Packs/day: 1.00    Years: 33.00    Additional pack years: 0.00    Total pack years: 33.00    Types: Cigars, Cigarettes    Last attempt to quit: 05/14/2010    Years since quitting: 12.2   Smokeless tobacco: Never  Substance Use Topics   Alcohol use: No  Comment: occasionally   Drug use: Yes    Types: Marijuana    Comment: occ     Allergies   Other and Morphine   Review of Systems Review of Systems   Physical Exam Triage Vital Signs ED Triage Vitals  Enc Vitals Group     BP      Pulse      Resp      Temp      Temp src      SpO2      Weight      Height      Head Circumference      Peak Flow      Pain Score      Pain Loc      Pain Edu?      Excl. in GC?    No data found.  Updated Vital Signs LMP 07/10/2017 (Approximate)   Visual Acuity Right Eye Distance:   Left Eye Distance:   Bilateral Distance:    Right Eye Near:   Left Eye Near:    Bilateral Near:     Physical Exam Constitutional:      Appearance: Normal appearance.  Eyes:     Extraocular Movements: Extraocular movements intact.  Pulmonary:     Effort: Pulmonary effort is normal.  Musculoskeletal:     Comments: Moderate to  severe swelling, erythema and tenderness present to the dorsum aspect of the left midfoot, difficulty bearing weight, sensation intact, capillary refill less than 3, 2+ pedal pulse  Neurological:     Mental Status: She is alert and oriented to person, place, and time. Mental status is at baseline.      UC Treatments / Results  Labs (all labs ordered are listed, but only abnormal results are displayed) Labs Reviewed - No data to display  EKG   Radiology No results found.  Procedures Procedures (including critical care time)  Medications Ordered in UC Medications - No data to display  Initial Impression / Assessment and Plan / UC Course  I have reviewed the triage vital signs and the nursing notes.  Pertinent labs & imaging results that were available during my care of the patient were reviewed by me and considered in my medical decision making (see chart for details).  Acute idiopathic gout of the left foot  Presentation and symptomology consistent with above diagnosis, discussed with patient, known history, methylprednisolone injection given in office and prescribed prednisone burst and tramadol for outpatient use, recommended supportive care through [facility], may follow-up with his urgent care as needed Final Clinical Impressions(s) / UC Diagnoses   Final diagnoses:  None   Discharge Instructions   None    ED Prescriptions   None    PDMP not reviewed this encounter.   Valinda Hoar, [place] 07/31/22 [phone]

## 2022-07-31 NOTE — Discharge Instructions (Addendum)
Today you are being treated for gout which is an inflammatory process  You have been given injection of steroids here today in the office, daily you will start to see some relief in about 30 minutes to an hour  Starting tomorrow take prednisone pills every morning with food for 5 days to continue the above process, you may take Tylenol 500 to 1000 mg every 6 hours in addition to this  For severe pain you may take tramadol every 6 hours as needed, be mindful of this medicine will make you feel drowsy  May use ice or heat over the affected area 10 to 15-minute intervals  You may continue activity as tolerated  When sitting and lying propped pillow on Lasix to help reduce swelling  For any further concerns you may follow-up with his urgent care as needed

## 2022-10-25 ENCOUNTER — Encounter (HOSPITAL_COMMUNITY): Payer: Self-pay

## 2022-10-25 ENCOUNTER — Ambulatory Visit (HOSPITAL_COMMUNITY)
Admission: EM | Admit: 2022-10-25 | Discharge: 2022-10-25 | Disposition: A | Discharge status: Home / Self Care | Payer: 59 | Attending: Family Medicine | Admitting: Family Medicine

## 2022-10-25 DIAGNOSIS — J441 Chronic obstructive pulmonary disease with (acute) exacerbation: Secondary | ICD-10-CM | POA: Diagnosis not present

## 2022-10-25 MED ORDER — ALBUTEROL SULFATE HFA 108 (90 BASE) MCG/ACT IN AERS: 2.0000 | INHALATION_SPRAY | RESPIRATORY_TRACT | 0 refills | Status: DC | PRN

## 2022-10-25 MED ORDER — IPRATROPIUM-ALBUTEROL 0.5-2.5 (3) MG/3ML IN SOLN
RESPIRATORY_TRACT | Status: AC
  Filled 2022-10-25: qty 3

## 2022-10-25 MED ORDER — IPRATROPIUM-ALBUTEROL 0.5-2.5 (3) MG/3ML IN SOLN
3.0000 mL | Freq: Once | RESPIRATORY_TRACT | Status: AC
  Administered 2022-10-25: 3 mL via RESPIRATORY_TRACT

## 2022-10-25 MED ORDER — PREDNISONE 20 MG PO TABS: 40.0000 mg | ORAL_TABLET | Freq: Every day | ORAL | 0 refills | Status: AC

## 2022-10-25 NOTE — ED Provider Notes (Signed)
[facility]    CSN: [id number] Arrival date & time: 10/25/22  1909      History   Chief Complaint Chief Complaint  Patient presents with   Shortness of Breath   Chest Pain    HPI Susan Wang is a 52 y.o. female.    Shortness of Breath Associated symptoms: chest pain   Chest Pain Associated symptoms: shortness of breath   Here for pleuritic anterior chest pain and shortness of breath.  Breathing definitely increases her chest pain.  She has been out of her albuterol inhaler.  She does have a history of COPD  Symptoms have been worsening over the last 2 days  About 7 to 10 days ago she did have some upper respiratory congestion and cough and diarrhea, but that has resolved.  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      Home Medications    Prior to Admission medications   Medication Sig Start Date End Date Taking? Authorizing Provider  albuterol (VENTOLIN HFA) 108 (90 Base) MCG/ACT inhaler Inhale 2 puffs into the lungs every 4 (four) hours as  needed for wheezing or shortness of breath. 10/25/22  Yes Zenia Resides, MD  predniSONE (DELTASONE) 20 MG tablet Take 2 tablets (40 mg total) by mouth daily with breakfast for 5 days. 10/25/22 10/30/22 Yes Sorcha Rotunno, Janace Aris, MD  acetaminophen (TYLENOL) 500 MG tablet Take 500 mg by mouth every 6 (six) hours as needed.    [provider]  gabapentin (NEURONTIN) 100 MG capsule Take 1 capsule (100 mg total) by mouth 3 (three) times daily. 03/09/22   PasedaBaird Kay, FNP    Family History Family History  Problem Relation Age of Onset   Other Mother    Arthritis Mother    Diabetes Father    Healthy Sister    Healthy Brother     Social History Social History   Tobacco Use   Smoking status: Former    Current packs/day: 0.00    Average packs/day: 1 pack/day for 33.0 years (33.0 ttl pk-yrs)    Types: Cigars, Cigarettes    Start date: 05/13/1977    Quit date: 05/14/2010    Years since quitting: 12.4   Smokeless tobacco: Never  Vaping Use   Vaping status: Never Used  Substance Use Topics   Alcohol use: No    Comment: occasionally   Drug use: Yes    Types: Marijuana    Comment: occ  Allergies   Other and Morphine   Review of Systems Review of Systems  Respiratory:  Positive for shortness of breath.   Cardiovascular:  Positive for chest pain.     Physical Exam Triage Vital Signs ED Triage Vitals  Encounter Vitals Group     BP 10/25/22 1914 (!) 167/90     Systolic BP Percentile --      Diastolic BP Percentile --      Pulse Rate 10/25/22 1914 (!) 56     Resp 10/25/22 1914 18     Temp 10/25/22 1914 98.6 F (37 C)     Temp Source 10/25/22 1914 Oral     SpO2 10/25/22 1914 99 %     Weight --      Height --      Head Circumference --      Peak Flow --      Pain Score 10/25/22 1915 9     Pain Loc --      Pain Education --      Exclude from Growth Chart --    No data found.  Updated Vital Signs BP (!) 167/90 (BP Location: Right Arm)   Pulse (!) 56    Temp 98.6 F (37 C) (Oral)   Resp 18   LMP 07/10/2017 (Approximate)   SpO2 99%   Visual Acuity Right Eye Distance:   Left Eye Distance:   Bilateral Distance:    Right Eye Near:   Left Eye Near:    Bilateral Near:     Physical Exam Vitals reviewed.  Constitutional:      Appearance: She is not diaphoretic.     Comments: She is in the triage room holding her chest.  No diaphoresis, but she is working to breathe.  HENT:     Mouth/Throat:     Mouth: Mucous membranes are moist.  Eyes:     Extraocular Movements: Extraocular movements intact.     Conjunctiva/sclera: Conjunctivae normal.     Pupils: Pupils are equal, round, and reactive to light.  Cardiovascular:     Rate and Rhythm: Regular rhythm.     Heart sounds: No murmur heard. Pulmonary:     Breath sounds: No stridor. No rhonchi.     Comments: She has bilateral expiratory wheezes with prolonged expiratory phase.  Her anterior chest is tender Musculoskeletal:     Cervical back: Neck supple.  Lymphadenopathy:     Cervical: No cervical adenopathy.  Skin:    Coloration: Skin is not jaundiced or pale.  Neurological:     Mental Status: She is alert and oriented to person, place, and time.  Psychiatric:        Behavior: Behavior normal.      UC Treatments / Results  Labs (all labs ordered are listed, but only abnormal results are displayed) Labs Reviewed - No data to display  EKG   Radiology No results found.  Procedures Procedures (including critical care time)  Medications Ordered in UC Medications  ipratropium-albuterol (DUONEB) 0.5-2.5 (3) MG/3ML nebulizer solution 3 mL (3 mLs Nebulization Given 10/25/22 1918)    Initial Impression / Assessment and Plan / UC Course  I have reviewed the triage vital signs and the nursing notes.  Pertinent labs & imaging results that were available during my care of the patient were reviewed by me and considered in my medical decision making (see chart for details).         After the [facility] treatment she felt much better, it was  no longer clutching her chest.  On reexamination there were no wheezes heard and she was moving air well.  She is still tender some on her anterior chest.   Final Clinical Impressions(s) / UC Diagnoses   Final diagnoses:  COPD exacerbation (HCC)     Discharge Instructions      Albuterol inhaler--do 2 puffs every 4 hours as needed for shortness of breath or wheezing (We gave you a breathing treatment with albuterol and ipratropium and in it)  Take prednisone 20 mg--2 daily for 5 days  You can use the QR code/website at the back of the summary paperwork to schedule yourself a new patient appointment with primary care      ED Prescriptions     Medication Sig Dispense Auth. Provider   predniSONE (DELTASONE) 20 MG tablet Take 2 tablets (40 mg total) by mouth daily with breakfast for 5 days. 10 tablet Jode Lippe, Janace Aris, MD   albuterol (VENTOLIN HFA) 108 (90 Base) MCG/ACT inhaler Inhale 2 puffs into the lungs every 4 (four) hours as needed for wheezing or shortness of breath. 1 each Zenia Resides, MD      PDMP not reviewed this encounter.   Zenia Resides, MD 10/25/22 [phone]

## 2022-10-25 NOTE — Discharge Instructions (Signed)
Albuterol inhaler--do 2 puffs every 4 hours as needed for shortness of breath or wheezing (We gave you a breathing treatment with albuterol and ipratropium and in it)  Take prednisone 20 mg--2 daily for 5 days  You can use the QR code/website at the back of the summary paperwork to schedule yourself a new patient appointment with primary care

## 2022-10-25 NOTE — ED Triage Notes (Signed)
Patient c/o chest pain and SOB x 2 days. Patient states she is out of her Albuterol inhaler.

## 2023-01-28 ENCOUNTER — Encounter (HOSPITAL_COMMUNITY): Payer: Self-pay | Admitting: *Deleted

## 2023-01-28 ENCOUNTER — Ambulatory Visit (INDEPENDENT_AMBULATORY_CARE_PROVIDER_SITE_OTHER): Payer: 59

## 2023-01-28 ENCOUNTER — Ambulatory Visit (HOSPITAL_COMMUNITY)
Admission: EM | Admit: 2023-01-28 | Discharge: 2023-01-28 | Disposition: A | Discharge status: Home / Self Care | Payer: 59 | Attending: Family Medicine | Admitting: Family Medicine

## 2023-01-28 DIAGNOSIS — M25511 Pain in right shoulder: Secondary | ICD-10-CM | POA: Diagnosis not present

## 2023-01-28 DIAGNOSIS — M542 Cervicalgia: Secondary | ICD-10-CM

## 2023-01-28 DIAGNOSIS — M5412 Radiculopathy, cervical region: Secondary | ICD-10-CM | POA: Diagnosis not present

## 2023-01-28 DIAGNOSIS — M47812 Spondylosis without myelopathy or radiculopathy, cervical region: Secondary | ICD-10-CM | POA: Diagnosis not present

## 2023-01-28 DIAGNOSIS — R200 Anesthesia of skin: Secondary | ICD-10-CM | POA: Diagnosis not present

## 2023-01-28 DIAGNOSIS — M40202 Unspecified kyphosis, cervical region: Secondary | ICD-10-CM | POA: Diagnosis not present

## 2023-01-28 DIAGNOSIS — M19011 Primary osteoarthritis, right shoulder: Secondary | ICD-10-CM | POA: Diagnosis not present

## 2023-01-28 MED ORDER — KETOROLAC TROMETHAMINE 30 MG/ML IJ SOLN
30.0000 mg | Freq: Once | INTRAMUSCULAR | Status: AC
  Administered 2023-01-28: 30 mg via INTRAMUSCULAR

## 2023-01-28 MED ORDER — KETOROLAC TROMETHAMINE 30 MG/ML IJ SOLN
INTRAMUSCULAR | Status: AC
  Filled 2023-01-28: qty 1

## 2023-01-28 MED ORDER — GABAPENTIN 100 MG PO CAPS: 100.0000 mg | ORAL_CAPSULE | Freq: Two times a day (BID) | ORAL | 0 refills | Status: DC

## 2023-01-28 MED ORDER — KETOROLAC TROMETHAMINE 10 MG PO TABS: 10.0000 mg | ORAL_TABLET | Freq: Four times a day (QID) | ORAL | 0 refills | Status: DC | PRN

## 2023-01-28 NOTE — Discharge Instructions (Signed)
By my review your neck x-rays show a lot of arthritis in your lower cervical spine.  Your shoulder x-ray shows some arthritis changes in the joint between the scapula and the shoulder.  The radiologist will also read your x-ray, and if their interpretation differs significantly from mine, we will call you.  You have been given a shot of Toradol 30 mg today.  Ketorolac 10 mg tablets--take 1 tablet every 6 hours as needed for pain.  This is the same medicine that is in the shot we just gave you  Gabapentin 100 mg--begin with 1 every evening and then increase to 2 every evening.  You can then start taking 1 every morning after a few days.  This medication can cause drowsiness or dizziness  You can use the QR code/website at the back of the summary paperwork to schedule yourself a new patient appointment with primary care

## 2023-01-28 NOTE — ED Triage Notes (Signed)
Pt states that she is unable to move her right arm, she is having shoulder pain and hand numbness X couple weeks. She states the pain has got worse and she is unable to move her arm at all. She has tried warm compress without relief. She states none of the gels or lidocaine helps at all.

## 2023-01-28 NOTE — ED Provider Notes (Signed)
[facility]    CSN: [id number] Arrival date & time: 01/28/23  1521      History   Chief Complaint Chief Complaint  Patient presents with   Shoulder Pain   Numbness    HPI Susan Wang is a 52 y.o. female.    Shoulder Pain Here for pain in her right neck and right shoulder.  It hurts to turn her head and it hurts to the abduct her right arm.  The pain is radiating down into her right hand and she has numbness and tingling in her arm and hand.  No recent trauma or fall.  No fever or cough or congestion or rash  She is allergic to morphine  Past Medical History:  Diagnosis Date   Anxiety    Arthritis    Bronchitis    Carpal tunnel syndrome    Carpal tunnel syndrome    Chest pain 09/2012   DDD (degenerative disc disease)    DJD (degenerative joint disease)    Osteoarthritis    knees   Plantar fasciitis    Shortness of breath     Patient Active Problem List   Diagnosis Date Noted   Cervical radiculopathy due to degenerative joint disease of spine 03/08/2022   Tobacco abuse counseling 03/08/2022   Superior mesenteric artery stenosis (HCC) 03/08/2022   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      Home Medications    Prior to Admission medications   Medication Sig Start Date End Date Taking? Authorizing Provider  gabapentin (NEURONTIN) 100 MG capsule Take 1-2 capsules (100-200 mg total) by mouth 2 (two) times daily. 01/28/23  Yes Zenia Resides, MD  ketorolac (TORADOL) 10 MG tablet Take 1 tablet (10 mg total) by mouth every 6 (six) hours as needed (pain).  01/28/23  Yes Zenia Resides, MD  acetaminophen (TYLENOL) 500 MG tablet Take 500 mg by mouth every 6 (six) hours as needed.    [provider]  albuterol (VENTOLIN HFA) 108 (90 Base) MCG/ACT inhaler Inhale 2 puffs into the lungs every 4 (four) hours as needed for wheezing or shortness of breath. 10/25/22   Zenia Resides, MD    Family History Family History  Problem Relation Age of Onset   Other Mother    Arthritis Mother    Diabetes Father    Healthy Sister    Healthy Brother     Social History Social History   Tobacco Use   Smoking status: Former    Current packs/day: 0.00    Average packs/day: 1 pack/day for 33.0 years (33.0 ttl pk-yrs)    Types: Cigars, Cigarettes    Start date: 05/13/1977    Quit date: 05/14/2010    Years since quitting: 12.7   Smokeless tobacco: Never  Vaping Use   Vaping status: Never Used  Substance Use Topics   Alcohol use: No    Comment: occasionally   Drug use: Yes    Types: Marijuana    Comment: occ     Allergies   Other and Morphine   Review of Systems Review  of Systems   Physical Exam Triage Vital Signs ED Triage Vitals  Encounter Vitals Group     BP 01/28/23 1654 (!) 145/83     Systolic BP Percentile --      Diastolic BP Percentile --      Pulse Rate 01/28/23 1654 (!) 54     Resp 01/28/23 1654 18     Temp 01/28/23 1654 98.5 F (36.9 C)     Temp Source 01/28/23 1654 Oral     SpO2 01/28/23 1654 98 %     Weight --      Height --      Head Circumference --      Peak Flow --      Pain Score 01/28/23 1653 10     Pain Loc --      Pain Education --      Exclude from Growth Chart --    No data found.  Updated Vital Signs BP (!) 145/83 (BP Location: Left Arm)   Pulse (!) 54   Temp 98.5 F (36.9 C) (Oral)   Resp 18   LMP 07/10/2017 (Approximate)   SpO2 98%   Visual Acuity Right Eye Distance:   Left Eye Distance:   Bilateral Distance:    Right Eye Near:   Left Eye Near:    Bilateral Near:      Physical Exam Vitals reviewed.  Constitutional:      General: She is not in acute distress.    Appearance: She is not ill-appearing, toxic-appearing or diaphoretic.  Eyes:     Extraocular Movements: Extraocular movements intact.     Pupils: Pupils are equal, round, and reactive to light.  Neck:     Comments: There is tenderness and spasm of the right trapezius at the neck Cardiovascular:     Rate and Rhythm: Normal rate and regular rhythm.  Pulmonary:     Effort: Pulmonary effort is normal.     Breath sounds: Normal breath sounds.  Musculoskeletal:     Comments: Range of motion about the right shoulder is limited due to pain.  She is holding her right arm close to her side  Neurological:     Mental Status: She is alert and oriented to person, place, and time.  Psychiatric:        Behavior: Behavior normal.      UC Treatments / Results  Labs (all labs ordered are listed, but only abnormal results are displayed) Labs Reviewed - No data to display  EKG   Radiology No results found.  Procedures Procedures (including critical care time)  Medications Ordered in UC Medications  ketorolac (TORADOL) 30 MG/ML injection 30 mg (has no administration in time range)    Initial Impression / Assessment and Plan / UC Course  I have reviewed the triage vital signs and the nursing notes.  Pertinent labs & imaging results that were available during my care of the patient were reviewed by me and considered in my medical decision making (see chart for details).     There are severe degenerative changes at about C5-C6 with spurring and narrowed disc spaces.  Also those vertebra are lower in volume Shoulder x-ray shows some mild degenerative changes at the [facility] joint..  Toradol is given here and is injection and tablets are sent to the pharmacy.  Gabapentin is sent in for neuropathic pain.  Shoulder sling is applied to help with her pain Final Clinical Impressions(s) / UC Diagnoses    Final diagnoses:  Neck  pain  Acute pain of right shoulder  Cervical radiculopathy     Discharge Instructions      By my review your neck x-rays show a lot of arthritis in your lower cervical spine.  Your shoulder x-ray shows some arthritis changes in the joint between the scapula and the shoulder.  The radiologist will also read your x-ray, and if their interpretation differs significantly from mine, we will call you.  You have been given a shot of Toradol 30 mg today.  Ketorolac 10 mg tablets--take 1 tablet every 6 hours as needed for pain.  This is the same medicine that is in the shot we just gave you  Gabapentin 100 mg--begin with 1 every evening and then increase to 2 every evening.  You can then start taking 1 every morning after a few days.  This medication can cause drowsiness or dizziness  You can use the QR code/website at the back of the summary paperwork to schedule yourself a new patient appointment with primary care       ED Prescriptions     Medication Sig Dispense Auth. Provider   ketorolac (TORADOL) 10 MG tablet Take 1 tablet (10 mg total) by mouth every 6 (six) hours as needed (pain). 20 tablet Zenia Resides, MD   gabapentin (NEURONTIN) 100 MG capsule Take 1-2 capsules (100-200 mg total) by mouth 2 (two) times daily. 120 capsule Zenia Resides, MD      PDMP not reviewed this encounter.   Zenia Resides, MD 01/28/23 [phone]

## 2023-04-06 ENCOUNTER — Telehealth: Payer: Self-pay | Admitting: Nurse Practitioner

## 2023-04-06 ENCOUNTER — Encounter: Payer: 59 | Admitting: Family

## 2023-04-06 NOTE — Telephone Encounter (Signed)
Called patient to reschedule missed appointment no answer left voicemail

## 2023-04-06 NOTE — Progress Notes (Signed)
Erroneous encounter-disregard

## 2023-06-15 ENCOUNTER — Other Ambulatory Visit: Payer: Self-pay

## 2023-06-15 ENCOUNTER — Ambulatory Visit (INDEPENDENT_AMBULATORY_CARE_PROVIDER_SITE_OTHER): Payer: Self-pay

## 2023-06-15 ENCOUNTER — Encounter (HOSPITAL_COMMUNITY): Payer: Self-pay | Admitting: Emergency Medicine

## 2023-06-15 ENCOUNTER — Ambulatory Visit (HOSPITAL_COMMUNITY)
Admission: EM | Admit: 2023-06-15 | Discharge: 2023-06-15 | Disposition: A | Discharge status: Home / Self Care | Payer: Self-pay | Attending: Family Medicine | Admitting: Family Medicine

## 2023-06-15 DIAGNOSIS — R600 Localized edema: Secondary | ICD-10-CM | POA: Insufficient documentation

## 2023-06-15 DIAGNOSIS — M47812 Spondylosis without myelopathy or radiculopathy, cervical region: Secondary | ICD-10-CM

## 2023-06-15 DIAGNOSIS — M5030 Other cervical disc degeneration, unspecified cervical region: Secondary | ICD-10-CM | POA: Insufficient documentation

## 2023-06-15 LAB — COMPREHENSIVE METABOLIC PANEL WITH GFR
ALT: 15 U/L (ref 0–44)
AST: 19 U/L (ref 15–41)
Albumin: 4.1 g/dL (ref 3.5–5.0)
Alkaline Phosphatase: 65 U/L (ref 38–126)
Anion gap: 10 (ref 5–15)
BUN: 11 mg/dL (ref 6–20)
CO2: 24 mmol/L (ref 22–32)
Calcium: 9.4 mg/dL (ref 8.9–10.3)
Chloride: 107 mmol/L (ref 98–111)
Creatinine, Ser: 0.88 mg/dL (ref 0.44–1.00)
GFR, Estimated: >60 mL/min (ref >60)
Glucose, Bld: 80 mg/dL (ref 70–99)
Potassium: 4.4 mmol/L (ref 3.5–5.1)
Sodium: 141 mmol/L (ref 135–145)
Total Bilirubin: 0.9 mg/dL (ref 0.0–1.2)
Total Protein: 7.1 g/dL (ref 6.5–8.1)

## 2023-06-15 LAB — CBC
HCT: 45.9 % (ref 36.0–46.0)
Hemoglobin: 15.1 g/dL — ABNORMAL HIGH (ref 12.0–15.0)
MCH: 30.4 pg (ref 26.0–34.0)
MCHC: 32.9 g/dL (ref 30.0–36.0)
MCV: 92.4 fL (ref 80.0–100.0)
Platelets: 186 10*3/uL (ref 150–400)
RBC: 4.97 MIL/uL (ref 3.87–5.11)
RDW: 12.9 % (ref 11.5–15.5)
WBC: 8.2 10*3/uL (ref 4.0–10.5)
nRBC: 0 % (ref 0.0–0.2)

## 2023-06-15 MED ORDER — TRIAMCINOLONE ACETONIDE 40 MG/ML IJ SUSP
40.0000 mg | Freq: Once | INTRAMUSCULAR | Status: AC
  Administered 2023-06-15: 40 mg via INTRAMUSCULAR

## 2023-06-15 MED ORDER — TRIAMCINOLONE ACETONIDE 40 MG/ML IJ SUSP
INTRAMUSCULAR | Status: AC
  Filled 2023-06-15: qty 1

## 2023-06-15 NOTE — ED Provider Notes (Signed)
 [facility]    CSN: [id number] Arrival date & time: 06/15/23  1039      History   Chief Complaint Chief Complaint  Patient presents with   Leg Swelling    HPI Susan Wang is a 53 y.o. female.   Patient is a 53 year old female with past medical history degenerative disc disease and cervical radiculopathy.  She has been having worsening pain with rotation of the neck and radiation of pain down the arms with some numbness and tingling in the left arm.  Pain is worse when she rotates to the right.  Typically her symptoms are on the left.  This is worsened since the fall she had approximate 1 month ago when she was in [place] .  Since returning she has had multiple issues to include bilateral extremity swelling, worsening of her arthritis and carpal tunnel.  She denies any chest pain, shortness of breath.  She is a everyday smoker but does not like her breathing is worsening.  She has not had primary care follow-up in a while nor blood work.     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      Home Medications    Prior to Admission medications   Medication Sig Start Date End Date Taking? Authorizing Provider  acetaminophen  (TYLENOL ) 500 MG tablet Take 500 mg by mouth every 6 (six) hours as needed.    [provider]    Family History Family History  Problem Relation Age of Onset   Other Mother    Arthritis Mother    Diabetes Father    Healthy Sister    Healthy Brother     Social History Social History   Tobacco Use   Smoking status: Every Day    Current packs/day: 0.00    Average packs/day: 1 pack/day for 33.0 years (33.0 ttl pk-yrs)    Types: Cigars, Cigarettes    Start date: 05/13/1977    Last attempt to quit: 05/14/2010    Years since quitting: 13.0   Smokeless tobacco: Never  Vaping Use   Vaping status: Never Used  Substance Use Topics   Alcohol use: Yes    Comment: occasionally   Drug use: Yes    Types: Marijuana    Comment: occ     Allergies   Other and Morphine    Review of Systems Review of Systems See HPI  Physical Exam Triage Vital Signs ED Triage Vitals  Encounter Vitals Group  BP 06/15/23 1104 (!) 165/93     Systolic BP Percentile --      Diastolic BP Percentile --      Pulse Rate 06/15/23 1104 60     Resp 06/15/23 1104 20     Temp 06/15/23 1104 98.4 F (36.9 C)     Temp Source 06/15/23 1104 Oral     SpO2 06/15/23 1104 95 %     Weight --      Height --      Head Circumference --      Peak Flow --      Pain Score 06/15/23 1102 5     Pain Loc --      Pain Education --      Exclude from Growth Chart --    No data found.  Updated Vital Signs BP (!) 165/93 (BP Location: Left Arm) Comment (BP Location): large cuff  Pulse 60   Temp 98.4 F (36.9 C) (Oral)   Resp 20   LMP 07/10/2017 (Approximate)   SpO2 95%   Visual Acuity Right Eye Distance:   Left Eye Distance:   Bilateral Distance:    Right Eye Near:   Left Eye Near:    Bilateral Near:     Physical  Exam Constitutional:      General: She is not in acute distress.    Appearance: Normal appearance. She is not ill-appearing, toxic-appearing or diaphoretic.  Cardiovascular:     Rate and Rhythm: Normal rate and regular rhythm.     Heart sounds: Normal heart sounds.  Pulmonary:     Effort: Pulmonary effort is normal.     Breath sounds: Normal breath sounds.  Musculoskeletal:        General: Normal range of motion.     Right lower leg: Edema present.     Left lower leg: Edema present.     Comments: Trace edema to bilateral lower extremities worse on the left 2+ pedal pulses bilateral. No palpable calf tenderness  Skin:    General: Skin is warm and dry.  Neurological:     Mental Status: She is alert.  Psychiatric:        Mood and Affect: Mood normal.      UC Treatments / Results  Labs (all labs ordered are listed, but only abnormal results are displayed) Labs Reviewed  CBC  COMPREHENSIVE METABOLIC PANEL WITH GFR    EKG   Radiology DG Cervical Spine Complete Result Date: 06/15/2023 CLINICAL DATA:  Neck pain EXAM: CERVICAL SPINE - COMPLETE 4+ VIEW COMPARISON:  January 28, 2023 FINDINGS: Multilevel degenerative disc disease with narrowing C3-C4, C4-C5, C5-C6 and C6-C7 with diffuse anterior osteophytic changes and posterior bony spondylosis. Bilateral C3-C6 foraminal narrowing right worse than left C1-C2 articulation and predental space are normal Prevertebral soft tissues are normal Spinolaminar line is intact IMPRESSION: Multilevel degenerative disc disease with narrowing C3-C4, C4-C5, C5-C6 and C6-C7 with diffuse anterior osteophytic changes and posterior bony spondylosis. Electronically Signed   By: Fredrich Jefferson M.D.   On: 06/15/2023 11:59    Procedures Procedures (including critical care time)  Medications Ordered in UC Medications  triamcinolone  acetonide (KENALOG -40) injection 40 mg (40 mg Intramuscular Given 06/15/23 1237)    Initial Impression / Assessment and  Plan / UC Course  I have reviewed the triage vital signs and the nursing notes.  Pertinent labs & imaging results that were available during my care of the patient were reviewed by me and considered in my medical decision making (  see chart for details).     Degenerative disc disease cervical-I believe that her degenerative disc disease is worsening.  She is having increased pain and radicular symptoms.  Potentially could be from the recent fall she had.  X-ray showed multilevel degenerative disc disease in the entire cervical spine.  Steroid injection given here for pain and inflammation.  Recommend she can take Tylenol  or Motrin  at home. Needs follow-up with neurosurgery  Peripheral edema-no concern for DVT at this time.  Most likely dependent edema The edema goes away with elevation. We will check some basic labs to ensure nothing more serious is going on. Recommend to elevate the legs is much as possible Compression stockings to the legs Needs primary care for follow-up  Contact given for primary care follow-up Final Clinical Impressions(s) / UC Diagnoses   Final diagnoses:  Osteoarthritis of cervical spine, unspecified spinal osteoarthritis complication status  Peripheral edema  DDD (degenerative disc disease), cervical     Discharge Instructions      The x-ray of your spine did show multilevel degenerative changes as we suspected.  I believe this is the cause of your symptoms.  You most likely have some nerve inflammation there. Steroid injection given here for pain and inflammation in the spine. Recommend Tylenol  and Motrin  for pain at home as needed We have obtained some blood work and we will call with any abnormal results. Likely about I recommend follow-up with a primary care doctor for management of these conditions.  You will need to see a neurosurgeon at some point. I have put a contact on your discharge instructions for primary care follow-up    ED Prescriptions    None    PDMP not reviewed this encounter.   Landa Pine, FNP 06/15/23 1246

## 2023-06-15 NOTE — ED Triage Notes (Signed)
 Bilateral leg swelling for a month.  Patient denies any changes to medications.  Patient does not have a pcp.    Has reported tingling in arms, particularly left.  Tingling in upper extremities for months, especially when moving her head in a certain direction  Patient reports a history of taking gabapentin -but it is gone.  Patient states this med was prescribed as 2 a day.  Patient states she took 3 to 6 tablets

## 2023-06-15 NOTE — Discharge Instructions (Addendum)
 The x-ray of your spine did show multilevel degenerative changes as we suspected.  I believe this is the cause of your symptoms.  You most likely have some nerve inflammation there. Steroid injection given here for pain and inflammation in the spine. Recommend Tylenol  and Motrin  for pain at home as needed We have obtained some blood work and we will call with any abnormal results. Likely about I recommend follow-up with a primary care doctor for management of these conditions.  You will need to see a neurosurgeon at some point. I have put a contact on your discharge instructions for primary care follow-up

## 2023-07-29 ENCOUNTER — Emergency Department (HOSPITAL_COMMUNITY)
Admission: EM | Admit: 2023-07-29 | Discharge: 2023-07-29 | Disposition: A | Discharge status: Home / Self Care | Payer: Self-pay

## 2023-07-29 ENCOUNTER — Emergency Department (HOSPITAL_COMMUNITY): Payer: Self-pay

## 2023-07-29 DIAGNOSIS — M4807 Spinal stenosis, lumbosacral region: Secondary | ICD-10-CM

## 2023-07-29 DIAGNOSIS — R001 Bradycardia, unspecified: Secondary | ICD-10-CM | POA: Insufficient documentation

## 2023-07-29 DIAGNOSIS — M51362 Other intervertebral disc degeneration, lumbar region with discogenic back pain and lower extremity pain: Secondary | ICD-10-CM

## 2023-07-29 DIAGNOSIS — M545 Low back pain, unspecified: Secondary | ICD-10-CM | POA: Insufficient documentation

## 2023-07-29 DIAGNOSIS — Z720 Tobacco use: Secondary | ICD-10-CM | POA: Insufficient documentation

## 2023-07-29 MED ORDER — CYCLOBENZAPRINE HCL 10 MG PO TABS: 10.0000 mg | ORAL_TABLET | Freq: Two times a day (BID) | ORAL | 0 refills | Status: AC | PRN

## 2023-07-29 MED ORDER — LIDOCAINE 5 % EX PTCH: 1.0000 | MEDICATED_PATCH | CUTANEOUS | 0 refills | Status: AC

## 2023-07-29 MED ORDER — CYCLOBENZAPRINE HCL 10 MG PO TABS
5.0000 mg | ORAL_TABLET | Freq: Once | ORAL | Status: AC
  Administered 2023-07-29: 5 mg via ORAL
  Filled 2023-07-29: qty 1

## 2023-07-29 MED ORDER — MELOXICAM 7.5 MG PO TABS: 7.5000 mg | ORAL_TABLET | Freq: Every day | ORAL | 0 refills | Status: AC

## 2023-07-29 MED ORDER — KETOROLAC TROMETHAMINE 15 MG/ML IJ SOLN
15.0000 mg | Freq: Once | INTRAMUSCULAR | Status: AC
  Administered 2023-07-29: 15 mg via INTRAVENOUS
  Filled 2023-07-29: qty 1

## 2023-07-29 MED ORDER — FENTANYL CITRATE PF 50 MCG/ML IJ SOSY
25.0000 ug | PREFILLED_SYRINGE | Freq: Once | INTRAMUSCULAR | Status: AC
  Administered 2023-07-29: 25 ug via INTRAVENOUS
  Filled 2023-07-29: qty 1

## 2023-07-29 MED ORDER — LIDOCAINE 5 % EX PTCH
1.0000 | MEDICATED_PATCH | CUTANEOUS | Status: DC
  Administered 2023-07-29: 1 via TRANSDERMAL
  Filled 2023-07-29: qty 1

## 2023-07-29 NOTE — ED Triage Notes (Signed)
 Per EMS from home. C/o lower back pain which radiates into bilateral hips. No recent falls. Hx of degenerative disc disease. BP elevated on the way to hospital but not sure of accuracy.  98 on RA HR 53 RR 18 CBG 96

## 2023-07-29 NOTE — Discharge Instructions (Addendum)
 Return to the emergency department if your symptoms worsen.  Call the number provided above to schedule an appointment with an orthopedist for further management of your symptoms.  Follow-up with your primary care provider.  Continue lidocaine  patch, you can apply 1 patch to the affected area daily as needed for pain.  Continue Flexeril , you can take 1 tablet up to 2 times daily as needed for muscle spasms, this medication may result in drowsiness.  Start meloxicam, 1 tablet daily for pain.  Do not take ibuprofen  while taking meloxicam, as these medications should not be used in conjunction with one another.  Continue Tylenol  as needed for pain.

## 2023-07-29 NOTE — ED Provider Notes (Signed)
 [place] [facility] AT [facility] Provider Note   CSN: [id number] Arrival date & time: 07/29/23  1014     History  Chief Complaint  Patient presents with   Back Pain    Susan Wang is a 53 y.o. female.  53 year old female presenting with back pain.  Patient reports that pain woke her up from her sleep this morning around 6 AM, she rolled over to get out of bed and reports that the pain was excruciating.  Patient has history of degenerative disc disease as well as chronic neck pain, however she reports "this pain is different".  She states "my right leg feels funny compared to my left", she notes pain with movement of her legs. She denies fever, nausea/vomiting/diarrhea, dysuria/hematuria, bowel/bladder incontinence.  She reports taking her prescribed gabapentin  as well as 2 of her daughters hydroxyzine tablets that she is prescribed for anxiety this morning to help with pain. Denies injury/inciting event.   Back Pain      Home Medications Prior to Admission medications   Medication Sig Start Date End Date Taking? Authorizing Provider  acetaminophen  (TYLENOL ) 500 MG tablet Take 500 mg by mouth every 6 (six) hours as needed.    [provider]      Allergies    Other and Morphine     Review of Systems   Review of Systems  Musculoskeletal:  Positive for back pain.    Physical Exam Updated Vital Signs BP (!) 155/103 (BP Location: Left Arm)   Pulse 65   Temp 98.5 F (36.9 C) (Oral)   Resp 14   LMP 07/10/2017 (Approximate)   SpO2 99%  Physical Exam Vitals and nursing note reviewed.  Constitutional:      General: She is in acute distress.     Appearance: She is not ill-appearing or toxic-appearing.  HENT:     Head: Normocephalic.  Eyes:     Extraocular Movements: Extraocular movements intact.     Pupils: Pupils are equal, round, and reactive to light.  Cardiovascular:     Rate and Rhythm: Regular rhythm. Bradycardia present.      Pulses:          Radial pulses are 2+ on the right side and 2+ on the left side.       Dorsalis pedis pulses are 2+ on the right side and 2+ on the left side.  Pulmonary:     Effort: Pulmonary effort is normal.     Breath sounds: Normal breath sounds.  Abdominal:     Palpations: Abdomen is soft.     Tenderness: There is no abdominal tenderness. There is no right CVA tenderness, left CVA tenderness or guarding.  Musculoskeletal:     Cervical back: Normal range of motion and neck supple. No tenderness.     Comments: Full range of motion of bilateral lower extremities, however movement is limited secondary to pain  Bony tenderness to palpation of L-spine with paralumbar muscle tenderness, no palpable step-off/deformity.  No bony tenderness to palpation of T-spine/C-spine, no palpable step-off/deformity.  Skin:    General: Skin is warm and dry.  Neurological:     Mental Status: She is alert and oriented to person, place, and time.     Comments: Patient notes diminished sensation to touch in the right lower extremity, otherwise no sensory deficits     ED Results / Procedures / Treatments   Labs (all labs ordered are listed, but only abnormal results are displayed) Labs Reviewed -  No data to display  EKG None  Radiology CT Lumbar Spine Wo Contrast Result Date: 07/29/2023 CLINICAL DATA:  Lower back pain radiating to the bilateral hips. No recent falls. History of degenerative disc disease. EXAM: CT LUMBAR SPINE WITHOUT CONTRAST TECHNIQUE: Multidetector CT imaging of the lumbar spine was performed without intravenous contrast administration. Multiplanar CT image reconstructions were also generated. RADIATION DOSE REDUCTION: This exam was performed according to the departmental dose-optimization program which includes automated exposure control, adjustment of the mA and/or kV according to patient size and/or use of iterative reconstruction technique. COMPARISON:  Lumbar spine radiographs  11/08/2019, CT abdomen pelvis 04/12/2007 FINDINGS: Segmentation: There are 5 non-rib-bearing lumbar-type vertebral bodies. Alignment: No sagittal spondylolisthesis. Minimal dextrocurvature centered at L2-3, similar to 11/08/2019 frontal radiograph. Vertebrae: Vertebral body heights are maintained. Disc spaces are preserved. Mild degenerative disc calcification of the posterior T12-L1 level. Mild anterior T11-12 greater than T12-L1 endplate osteophytes. Sclerosis within the anterior superior L1 vertebral body, likely degenerative and similar to 11/08/2019 radiographs. Paraspinal and other soft tissues: Cholecystectomy clips. Small splenule medial to the inferior aspect of the spleen. Disc levels: T12-L1: Minimal midline posterior disc protrusion with calcification. No central canal or neuroforaminal stenosis. L1-2: Mild left lateral recess posterior disc protrusion measuring up to 4 mm in AP dimension. Mild left lateral recess stenosis. No central canal stenosis. Mild bilateral facet joint hypertrophy. No neuroforaminal stenosis. L2-3: Mild bilateral facet joint hypertrophy. No posterior disc bulge. Very mild left neuroforaminal narrowing secondary to the scoliotic curvature. No true stenosis. No central canal stenosis. L3-4: Mild bilateral facet joint hypertrophy. No posterior disc bulge, central canal narrowing, or neuroforaminal stenosis. L4-5: Moderate bilateral facet joint hypertrophy and sclerosis. Bilateral facet joint vacuum phenomenon. Mild bilateral intraforaminal disc extension. Mild to moderate left and mild right neuroforaminal stenosis. Mild-to-moderate central canal stenosis. L5-S1: Severe right and moderate severe left facet joint sclerosis and hypertrophy. Right facet joint vacuum phenomenon. Minimal bilateral lateral recess and intraforaminal disc extension. No central canal stenosis. Bilateral intraforaminal facet joint spurs. Moderate left and mild-to-moderate right neuroforaminal stenosis.  IMPRESSION: 1. Multilevel degenerative disc and joint changes as above. 2. Mild-to-moderate central canal stenosis and mild to moderate left and mild right neuroforaminal stenosis at L4-5. 3. Moderate left and mild-to-moderate right neuroforaminal stenosis at L5-S1. 4. Mild left lateral recess stenosis at L1-2. Electronically Signed   By: Bertina Broccoli M.D.   On: 07/29/2023 11:58    Procedures Procedures    Medications Ordered in ED Medications  lidocaine  (LIDODERM ) 5 % 1 patch (1 patch Transdermal Patch Applied 07/29/23 1217)  ketorolac  (TORADOL ) 15 MG/ML injection 15 mg (15 mg Intravenous Given 07/29/23 1054)  cyclobenzaprine  (FLEXERIL ) tablet 5 mg (5 mg Oral Given 07/29/23 1053)  fentaNYL (SUBLIMAZE) injection 25 mcg (25 mcg Intravenous Given 07/29/23 1217)    ED Course/ Medical Decision Making/ A&P                                 Medical Decision Making This patient presents to the ED for concern of back pain, this involves an extensive number of treatment options, and is a complaint that carries with it a high risk of complications and morbidity.  The differential diagnosis includes fracture, DDD vs disc herniation, muscle sprain/strain.   Co morbidities that complicate the patient evaluation  History of degenerative joint disease and cervical radiculopathy   Additional history obtained:  Additional history obtained from record review External  records from outside source obtained and reviewed including prior ED/UC notes    Imaging Studies ordered:  I ordered imaging studies including CT L-spine  I independently visualized and interpreted imaging which showed  1. Multilevel degenerative disc and joint changes as above. 2. Mild-to-moderate central canal stenosis and mild to moderate left and mild right neuroforaminal stenosis at L4-5. 3. Moderate left and mild-to-moderate right neuroforaminal stenosis at L5-S1. 4. Mild left lateral recess stenosis at L1-2.  I agree with the  radiologist interpretation   Cardiac Monitoring: / EKG:  The patient was maintained on a cardiac monitor.  I personally viewed and interpreted the cardiac monitored which showed an underlying rhythm of: normal sinus rhythm     Problem List / ED Course / Critical interventions / Medication management  I ordered medication including toradol  and flexeril  for pain, pain persist despite these measures, 25 mcg fentanyl given along with lidocaine  patch Reevaluation of the patient after these medicines showed that the patient improved I have reviewed the patients home medicines and have made adjustments as needed   Social Determinants of Health:  Tobacco use   Test / Admission - Considered:  Physical exam is notable for midline bony tenderness of the lumbar spine, no palpable step-off/deformity, no tenderness step-off/deformity of T-spine or C-spine.  Patient is able to move both lower extremities appropriately, however movement is limited secondary to pain.  She does note mild sensory deficit to the right lower extremity as compared to the left, this is consistent with sciatica/radicular type symptoms.  No red flag back symptoms including fever/urinary or bowel incontinence, I believe her symptoms today are secondary to degenerative disc disease and spinal stenosis.  See above for imaging interpretation.  Will have patient follow-up with orthopedic specialist for further management of her symptoms.  Will prescribe meloxicam, Flexeril , and lidocaine  patches for management of patient's pain.  Patient understands she should not take ibuprofen  while taking meloxicam.  She can continue Tylenol  as needed for pain.  Recommend patient follow-up with her primary care provider.  Patient voiced understanding and is appropriate for discharge at this time.    Amount and/or Complexity of Data Reviewed Radiology: ordered.  Risk Prescription drug management.           Final Clinical Impression(s) /  ED Diagnoses Final diagnoses:  Midline low back pain, unspecified chronicity, unspecified whether sciatica present    Rx / DC Orders ED Discharge Orders          Ordered    cyclobenzaprine  (FLEXERIL ) 10 MG tablet  2 times daily PRN        07/29/23 1234    lidocaine  (LIDODERM ) 5 %  Every 24 hours        07/29/23 1234    meloxicam (MOBIC) 7.5 MG tablet  Daily        07/29/23 1234              Kendrick Pax, [place] 07/29/23 1242    Rolinda Climes, DO 07/29/23 1524

## 2023-08-01 ENCOUNTER — Telehealth: Payer: Self-pay

## 2023-08-01 NOTE — Transitions of Care (Post Inpatient/ED Visit) (Signed)
   08/01/2023  Name: Susan Wang MRN: [id number] DOB: 02/14/1971  Today's TOC FU Call Status: Today's TOC FU Call Status:: Unsuccessful Call (1st Attempt) Unsuccessful Call (1st Attempt) Date: 08/01/23  Attempted to reach the patient regarding the most recent Inpatient/ED visit.  Follow Up Plan: Additional outreach attempts will be made to reach the patient to complete the Transitions of Care (Post Inpatient/ED visit) call.   Signature Briannie Gutierrez [employer]

## 2023-08-02 ENCOUNTER — Telehealth: Payer: Self-pay

## 2023-08-02 NOTE — Transitions of Care (Post Inpatient/ED Visit) (Signed)
   08/02/2023  Name: Susan Wang MRN: [id number] DOB: [birth year]  Today's TOC FU Call Status: Today's TOC FU Call Status:: Unsuccessful Call (2nd Attempt) Unsuccessful Call (2nd Attempt) Date: 08/02/23  Attempted to reach the patient regarding the most recent Inpatient/ED visit.  Follow Up Plan: Additional outreach attempts will be made to reach the patient to complete the Transitions of Care (Post Inpatient/ED visit) call.   Signature  [employer], [place]

## 2023-08-04 ENCOUNTER — Telehealth: Payer: Self-pay

## 2023-08-04 NOTE — Transitions of Care (Post Inpatient/ED Visit) (Signed)
   08/04/2023  Name: Susan Wang MRN: [id number] DOB: 1971-01-10  Today's TOC FU Call Status: Today's TOC FU Call Status:: Unsuccessful Call (3rd Attempt) Unsuccessful Call (3rd Attempt) Date: 08/04/23  Attempted to reach the patient regarding the most recent Inpatient/ED visit.  Follow Up Plan: No further outreach attempts will be made at this time. We have been unable to contact the patient.  Signature  [employer], [place]

## 2023-09-16 NOTE — Patient Instructions (Incomplete)

## 2023-09-16 NOTE — Progress Notes (Deleted)
 New Patient Visit  Subjective:     Patient ID: Susan Wang, female    DOB: 1971-02-08, 53 y.o.   MRN: [id number]  No chief complaint on file.   HPI  Discussed the use of AI scribe software for clinical note transcription with the patient, who gave verbal consent to proceed.  History of Present Illness      ROS Per HPI  Outpatient Encounter Medications as of 09/19/2023  Medication Sig   acetaminophen  (TYLENOL ) 500 MG tablet Take 500 mg by mouth every 6 (six) hours as needed.   cyclobenzaprine  (FLEXERIL ) 10 MG tablet Take 1 tablet (10 mg total) by mouth 2 (two) times daily as needed for muscle spasms.   lidocaine  (LIDODERM ) 5 % Place 1 patch onto the skin daily. Remove & Discard patch within 12 hours or as directed by MD   meloxicam  (MOBIC ) 7.5 MG tablet Take 1 tablet (7.5 mg total) by mouth daily.   No facility-administered encounter medications on file as of 09/19/2023.    Past Medical History:  Diagnosis Date   Anxiety    Arthritis    Bronchitis    Carpal tunnel syndrome    Carpal tunnel syndrome    Chest pain 09/2012   DDD (degenerative disc disease)    DJD (degenerative joint disease)    Osteoarthritis    knees   Plantar fasciitis    Shortness of breath     Past Surgical History:  Procedure Laterality Date   CHOLECYSTECTOMY      Family History  Problem Relation Age of Onset   Other Mother    Arthritis Mother    Diabetes Father    Healthy Sister    Healthy Brother     Social History   Socioeconomic History   Marital status: Married    Spouse name: Not on file   Number of children: 2   Years of education: Not on file   Highest education level: GED or equivalent  Occupational History   Not on file  Tobacco Use   Smoking status: Every Day    Current packs/day: 0.00    Average packs/day: 1 pack/day for 33.0 years (33.0 ttl pk-yrs)    Types: Cigars, Cigarettes    Start date: 05/13/1977    Last attempt to quit: 05/14/2010    Years since  quitting: 13.3   Smokeless tobacco: Never  Vaping Use   Vaping status: Never Used  Substance and Sexual Activity   Alcohol use: Yes    Comment: occasionally   Drug use: Yes    Types: Marijuana    Comment: occ   Sexual activity: Yes    Birth control/protection: None  Other Topics Concern   Not on file  Social History Narrative   Lives with her [place] in [place]. She has a job.    Social Drivers of Health   Financial Resource Strain: High Risk (09/15/2023)   Overall Financial Resource Strain (CARDIA)    Difficulty of Paying Living Expenses: Very hard  Food Insecurity: Food Insecurity Present (09/15/2023)   Hunger Vital Sign    Worried About Running Out of Food in the Last Year: Often true    Ran Out of Food in the Last Year: Often true  Transportation Needs: Unmet Transportation Needs (09/15/2023)   PRAPARE - Administrator, Civil Service (Medical): Yes    Lack of Transportation (Non-Medical): Yes  Physical Activity: Insufficiently Active (09/15/2023)   Exercise Vital Sign    Days of Exercise  per Week: 4 days    Minutes of Exercise per Session: 20 min  Stress: Stress Concern Present (09/15/2023)   [employer] of Occupational Health - Occupational Stress Questionnaire    Feeling of Stress: Very much  Social Connections: Moderately Integrated (09/15/2023)   Social Connection and Isolation Panel    Frequency of Communication with Friends and Family: Three times a week    Frequency of Social Gatherings with Friends and Family: Patient declined    Attends Religious Services: 1 to 4 times per year    Active Member of [employer] or Organizations: No    Attends Banker Meetings: Not on file    Marital Status: Married  Intimate Partner Violence: Not on file       Objective:    LMP 07/10/2017 (Approximate)    Physical Exam Vitals and nursing note reviewed.  Constitutional:      General: She is not in acute distress.    Appearance: Normal  appearance. She is normal weight.  HENT:     Head: Normocephalic and atraumatic.     Right Ear: External ear normal.     Left Ear: External ear normal.     Nose: Nose normal.     Mouth/Throat:     Mouth: Mucous membranes are moist.     Pharynx: Oropharynx is clear.  Eyes:     Extraocular Movements: Extraocular movements intact.     Pupils: Pupils are equal, round, and reactive to light.  Cardiovascular:     Rate and Rhythm: Normal rate and regular rhythm.     Pulses: Normal pulses.     Heart sounds: Normal heart sounds.  Pulmonary:     Effort: Pulmonary effort is normal. No respiratory distress.     Breath sounds: Normal breath sounds. No wheezing, rhonchi or rales.  Musculoskeletal:        General: Normal range of motion.     Cervical back: Normal range of motion.     Right lower leg: No edema.     Left lower leg: No edema.  Lymphadenopathy:     Cervical: No cervical adenopathy.  Neurological:     General: No focal deficit present.     Mental Status: She is alert and oriented to person, place, and time.  Psychiatric:        Mood and Affect: Mood normal.        Thought Content: Thought content normal.     No results found for any visits on 09/19/23.      Assessment & Plan:   Assessment and Plan Assessment & Plan      No orders of the defined types were placed in this encounter.    No orders of the defined types were placed in this encounter.   No follow-ups on file.  Corean LITTIE Ku, FNP

## 2023-09-19 ENCOUNTER — Ambulatory Visit: Payer: Self-pay | Admitting: Family Medicine

## 2023-09-19 DIAGNOSIS — M1909 Primary osteoarthritis, other specified site: Secondary | ICD-10-CM

## 2023-09-19 DIAGNOSIS — K219 Gastro-esophageal reflux disease without esophagitis: Secondary | ICD-10-CM

## 2023-09-19 DIAGNOSIS — K551 Chronic vascular disorders of intestine: Secondary | ICD-10-CM
